# Patient Record
Sex: Female | Born: 1990 | Race: Black or African American | Hispanic: No | Marital: Single | Smoking: Former smoker
Health system: Southern US, Community
[De-identification: ages and names within clinical notes are randomized; demographics above are authoritative.]

## PROBLEM LIST (undated history)

## (undated) ENCOUNTER — Inpatient Hospital Stay (HOSPITAL_COMMUNITY): Payer: Self-pay

## (undated) DIAGNOSIS — A749 Chlamydial infection, unspecified: Secondary | ICD-10-CM

## (undated) DIAGNOSIS — A599 Trichomoniasis, unspecified: Secondary | ICD-10-CM

## (undated) DIAGNOSIS — A549 Gonococcal infection, unspecified: Secondary | ICD-10-CM

## (undated) HISTORY — PX: THERAPEUTIC ABORTION: SHX798

---
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
## 2012-01-01 LAB — GC/CHLAMYDIA PROBE AMP, GENITAL
Chlamydia, DNA Probe: NEGATIVE
GC Probe Amp, Genital: NEGATIVE

## 2012-01-02 ENCOUNTER — Inpatient Hospital Stay (HOSPITAL_COMMUNITY)
Admission: AD | Admit: 2012-01-02 | Discharge: 2012-01-02 | Disposition: A | Discharge status: Home / Self Care | Payer: Federal, State, Local not specified - PPO | Source: Ambulatory Visit | Attending: Obstetrics & Gynecology | Admitting: Obstetrics & Gynecology

## 2012-01-02 DIAGNOSIS — O99891 Other specified diseases and conditions complicating pregnancy: Secondary | ICD-10-CM | POA: Insufficient documentation

## 2012-01-02 DIAGNOSIS — Z09 Encounter for follow-up examination after completed treatment for conditions other than malignant neoplasm: Secondary | ICD-10-CM

## 2012-01-02 LAB — HCG, QUANTITATIVE, PREGNANCY: hCG, Beta Chain, Quant, S: 3296 m[IU]/mL — ABNORMAL HIGH (ref <5)

## 2012-01-02 NOTE — MAU Provider Note (Signed)
Lauren Diaz is a 21 y.o. female who returns to MAU for follow up BHCG. On her visit 2 days ago the Bhcg was 1517 and ultrasound showed an IUGS but no YS. Patient denies pain or bleeding today.  Results for orders placed during the hospital encounter of 01/02/12 (from the past 24 hour(s))  HCG, QUANTITATIVE, PREGNANCY     Status: Abnormal   Collection Time   01/02/12  4:10 PM      Component Value Range   hCG, Beta Chain, Quant, S 3296 (*) <5 mIU/mL   BP 122/72  Pulse 91  Temp 99.8 F (37.7 C) (Oral)  Resp 16  LMP 05/31/2011  There is a normal rise in the Bhcg today.   Assessment : Normal rise in Bhcg   First trimester pregnancy  Plan:  Start prenatal care   Return for any problems. Medication List  As of 01/02/2012  4:56 PM   CONTINUE taking these medications         metroNIDAZOLE 500 MG tablet   Commonly known as: FLAGYL   Take 1 tablet (500 mg total) by mouth 2 (two) times daily.           Follow-up Information    Schedule an appointment as soon as possible for a visit to follow up. (with OB of  choice)        I have reviewed this patient's vital signs, nurses notes, appropriate labs and imaging. I have discussed results and follow up plan of care with the patient and she voices understanding.

## 2012-01-02 NOTE — MAU Note (Signed)
Patient here for  BHCG denies pain or bleeding.

## 2012-01-03 NOTE — MAU Provider Note (Signed)
Agree with above note.  Novelle Addair 01/03/2012 9:13 AM   

## 2012-08-19 ENCOUNTER — Encounter (HOSPITAL_COMMUNITY): Payer: Self-pay | Admitting: Cardiology

## 2012-08-19 ENCOUNTER — Emergency Department (HOSPITAL_COMMUNITY)
Admission: EM | Admit: 2012-08-19 | Discharge: 2012-08-19 | Disposition: A | Discharge status: Home / Self Care | Payer: Federal, State, Local not specified - PPO | Attending: Emergency Medicine | Admitting: Emergency Medicine

## 2012-08-19 DIAGNOSIS — R109 Unspecified abdominal pain: Secondary | ICD-10-CM | POA: Insufficient documentation

## 2012-08-19 DIAGNOSIS — Z8619 Personal history of other infectious and parasitic diseases: Secondary | ICD-10-CM | POA: Insufficient documentation

## 2012-08-19 DIAGNOSIS — R61 Generalized hyperhidrosis: Secondary | ICD-10-CM | POA: Insufficient documentation

## 2012-08-19 DIAGNOSIS — Z3202 Encounter for pregnancy test, result negative: Secondary | ICD-10-CM | POA: Insufficient documentation

## 2012-08-19 DIAGNOSIS — N898 Other specified noninflammatory disorders of vagina: Secondary | ICD-10-CM | POA: Insufficient documentation

## 2012-08-19 DIAGNOSIS — R112 Nausea with vomiting, unspecified: Secondary | ICD-10-CM | POA: Insufficient documentation

## 2012-08-19 DIAGNOSIS — F17200 Nicotine dependence, unspecified, uncomplicated: Secondary | ICD-10-CM | POA: Insufficient documentation

## 2012-08-19 LAB — CBC WITH DIFFERENTIAL/PLATELET
Eosinophils Absolute: 0 10*3/uL (ref 0.0–0.7)
Eosinophils Relative: 0 % (ref 0–5)
HCT: 36.3 % (ref 36.0–46.0)
Hemoglobin: 12.5 g/dL (ref 12.0–15.0)
Lymphs Abs: 1.1 10*3/uL (ref 0.7–4.0)
MCH: 30.1 pg (ref 26.0–34.0)
MCV: 87.5 fL (ref 78.0–100.0)
Monocytes Absolute: 1 10*3/uL (ref 0.1–1.0)
Monocytes Relative: 7 % (ref 3–12)
RBC: 4.15 MIL/uL (ref 3.87–5.11)

## 2012-08-19 LAB — BASIC METABOLIC PANEL
BUN: 8 mg/dL (ref 6–23)
Calcium: 9.2 mg/dL (ref 8.4–10.5)
Creatinine, Ser: 0.74 mg/dL (ref 0.50–1.10)
GFR calc non Af Amer: >90 mL/min (ref >90)
Glucose, Bld: 99 mg/dL (ref 70–99)
Potassium: 3.4 mEq/L — ABNORMAL LOW (ref 3.5–5.1)

## 2012-08-19 LAB — URINALYSIS, ROUTINE W REFLEX MICROSCOPIC
Glucose, UA: NEGATIVE mg/dL
Protein, ur: 30 mg/dL — AB

## 2012-08-19 LAB — POCT PREGNANCY, URINE: Preg Test, Ur: NEGATIVE

## 2012-08-19 LAB — URINE MICROSCOPIC-ADD ON

## 2012-08-19 MED ORDER — ONDANSETRON HCL 4 MG PO TABS: 4.0000 mg | ORAL_TABLET | Freq: Four times a day (QID) | ORAL | Status: DC

## 2012-08-19 MED ORDER — ONDANSETRON HCL 4 MG/2ML IJ SOLN
4.0000 mg | INTRAMUSCULAR | Status: AC
  Administered 2012-08-19: 4 mg via INTRAVENOUS

## 2012-08-19 MED ORDER — ONDANSETRON HCL 4 MG/2ML IJ SOLN
INTRAMUSCULAR | Status: AC
  Filled 2012-08-19: qty 2

## 2012-08-19 MED ORDER — ONDANSETRON HCL 4 MG/2ML IJ SOLN
4.0000 mg | INTRAMUSCULAR | Status: AC
  Administered 2012-08-19: 4 mg via INTRAVENOUS
  Filled 2012-08-19: qty 2

## 2012-08-19 MED ORDER — SODIUM CHLORIDE 0.9 % IV BOLUS (SEPSIS)
1000.0000 mL | Freq: Once | INTRAVENOUS | Status: AC
  Administered 2012-08-19: 1000 mL via INTRAVENOUS

## 2012-08-19 NOTE — ED Notes (Signed)
Pt reports mid abdominal pain starting at 0600. States that she has had constant nausea and vomited x 20 times today. Denies dysuria, vaginal discharge, and odor. Pt is having her menses at this time. Pt reports being recently diagnosed with an STD two weeks ago.

## 2012-08-19 NOTE — ED Notes (Signed)
Pt reports generalized abd pain that started this morning when she woke up. States she has had some episodes of vomiting today. Denies any urinary symptoms or flank pain.

## 2012-08-19 NOTE — ED Provider Notes (Signed)
History     CSN: [id number]  Arrival date & time 08/19/12  1320   First MD Initiated Contact with Patient 08/19/12 [phone]      Chief Complaint  Patient presents with  . Abdominal Pain  . Emesis    (Consider location/radiation/quality/duration/timing/severity/associated sxs/prior treatment) HPI Comments: Patient is a 22 year old female who presents for multiple episodes of emesis starting at 9:00 this morning. Patient states she woke up feeling nauseous and has had approximately 30 episodes of nonbloody emesis since symptom onset. Patient states she has not been able to keep down food or fluids by mouth and her last episode of emesis was 15 minutes ago while waiting in the waiting room. Patient denies any aggravating or alleviating factors. Patient admits to an aching periumbilical pain as well as becoming diaphoretic after her episodes of emesis. Patient denies fever, vision changes, inability to swallow, chest pain, shortness of breath, diarrhea, or numbness or tingling in her extremities. Patient states her last bowel movement and was this morning and was normal in color and consistency. Patient's menstrual cycle began 2 days ago. Patient denies a history of abdominal surgery.  Patient is a 22 y.o. female presenting with abdominal pain and vomiting. The history is provided by the patient. No language interpreter was used.  Abdominal Pain Associated symptoms: nausea and vomiting   Associated symptoms: no chest pain, no chills, no constipation, no diarrhea, no dysuria, no fever, no hematuria and no shortness of breath   Emesis Associated symptoms: abdominal pain   Associated symptoms: no chills and no diarrhea     Past Medical History  Diagnosis Date  . Trichomonas   . Gonorrhea   . Chlamydia     Past Surgical History  Procedure Laterality Date  . No past surgeries      Family History  Problem Relation Age of Onset  . Anesthesia problems Neg Hx     History  Substance Use  Topics  . Smoking status: Current Some Day Smoker  . Smokeless tobacco: Never Used  . Alcohol Use: Yes     Comment: weekends    OB History   Grav Para Term Preterm Abortions TAB SAB Ect Mult Living   1               Review of Systems  Constitutional: Negative for fever and chills.  HENT: Negative for trouble swallowing, neck pain and neck stiffness.   Eyes: Negative for visual disturbance.  Respiratory: Negative for chest tightness and shortness of breath.   Cardiovascular: Negative for chest pain.  Gastrointestinal: Positive for nausea, vomiting and abdominal pain. Negative for diarrhea, constipation and blood in stool.  Genitourinary: Negative for dysuria, urgency, hematuria, vaginal pain and pelvic pain.       Patient currently menstruating  Musculoskeletal: Negative for back pain.  Neurological: Negative for dizziness, syncope, weakness, light-headedness and numbness.  All other systems reviewed and are negative.    Allergies  Penicillins  Home Medications   Current Outpatient Rx  Name  Route  Sig  Dispense  Refill  . Lactobacillus (REPHRESH PRO-B) CAPS   Oral   Take 1 capsule by mouth daily.         . ondansetron (ZOFRAN) 4 MG tablet   Oral   Take 1 tablet (4 mg total) by mouth every 6 (six) hours.   12 tablet   0     BP 106/52  Pulse 96  Temp(Src) 99.2 F (37.3 C) (Oral)  Resp 16  SpO2  98%  LMP 08/17/2012  Physical Exam  Nursing note and vitals reviewed. Constitutional: She is oriented to person, place, and time. She appears well-developed and well-nourished. No distress.  Patient is lying comfortably in her bed, smiling, and conversing happily. She is nontoxic appearing and in no acute distress.  HENT:  Head: Normocephalic and atraumatic.  Mouth/Throat: Oropharynx is clear and moist.  Neck: Normal range of motion. Neck supple.  Cardiovascular: Normal rate, regular rhythm, normal heart sounds and intact distal pulses.   Pulmonary/Chest: Effort  normal and breath sounds normal. No respiratory distress. She has no wheezes. She has no rales.  Abdominal: Soft. Bowel sounds are normal. She exhibits no distension and no mass. There is no rebound and no guarding.  Patient without focal tenderness on deep palpation of abdomen. No peritoneal signs or suprapubic tenderness. No Murphy's sign or tenderness at McBurney's point.  Musculoskeletal: Normal range of motion.  Neurological: She is alert and oriented to person, place, and time.  Skin: Skin is warm and dry. No rash noted. She is not diaphoretic. No erythema.  Psychiatric: She has a normal mood and affect. Her behavior is normal.    ED Course  Procedures (including critical care time)  Labs Reviewed  CBC WITH DIFFERENTIAL - Abnormal; Notable for the following:    WBC 14.1 (*)    Neutrophils Relative 85 (*)    Neutro Abs 11.9 (*)    Lymphocytes Relative 8 (*)    All other components within normal limits  BASIC METABOLIC PANEL - Abnormal; Notable for the following:    Potassium 3.4 (*)    All other components within normal limits  URINALYSIS, ROUTINE W REFLEX MICROSCOPIC - Abnormal; Notable for the following:    Hgb urine dipstick LARGE (*)    Protein, ur 30 (*)    Leukocytes, UA TRACE (*)    All other components within normal limits  LIPASE, BLOOD  URINE MICROSCOPIC-ADD ON  POCT PREGNANCY, URINE   No results found.   1. Nausea and vomiting      MDM  Patient is a 22 year old female who presents after having multiple episodes of emesis onset 9 AM this morning. Patient's lab significant for mild leukocytosis of 14.1 and mild hypokalemia of 3.4. Patient's urinalysis findings consistent with dehydration and the patient's current menstrual cycle; urine pregnancy negative. Patient's lipase not elevated. Patient without focal tenderness on deep palpation or peritoneal signs; no tenderness at McBurney's point or Murphy's sign. The patient is conversing happily and nontoxic appearing,  in no acute distress. Will manage patient in ED today with IV fluids as well as IV Zofran for nausea.  Patient states that nausea has improved and she is now tolerating fluids by mouth. She has remained well and nontoxic appearing; hemodynamically stable. Patient will be discharged with primary care followup and Zofran to take as needed for nausea and vomiting. Indications for ED return discussed. Patient states comfort and understanding with this discharge plan without any unaddressed concerns. Patient work up and management discussed with Dr. Blinda Leatherwood who is in agreement.      Antony Madura, PA-C 08/20/12 1200

## 2012-08-20 NOTE — ED Provider Notes (Signed)
Medical screening examination/treatment/procedure(s) were performed by non-physician practitioner and as supervising physician I was immediately available for consultation/collaboration.  Christopher J. Pollina, MD 08/20/12 1700 

## 2012-09-18 ENCOUNTER — Inpatient Hospital Stay (HOSPITAL_COMMUNITY)
Admission: AD | Admit: 2012-09-18 | Discharge: 2012-09-18 | Disposition: A | Discharge status: Home / Self Care | Payer: Federal, State, Local not specified - PPO | Source: Ambulatory Visit | Attending: Obstetrics and Gynecology | Admitting: Obstetrics and Gynecology

## 2012-09-18 ENCOUNTER — Encounter (HOSPITAL_COMMUNITY): Payer: Self-pay | Admitting: *Deleted

## 2012-09-18 DIAGNOSIS — Z3201 Encounter for pregnancy test, result positive: Secondary | ICD-10-CM

## 2012-09-18 MED ORDER — PRENATAL 27-0.8 MG PO TABS: 1.0000 | ORAL_TABLET | Freq: Every day | ORAL | Status: DC

## 2012-09-18 NOTE — MAU Provider Note (Signed)
Attestation of Attending Supervision of Advanced Practitioner (CNM/NP): Evaluation and management procedures were performed by the Advanced Practitioner under my supervision and collaboration.  I have reviewed the Advanced Practitioner's note and chart, and I agree with the management and plan.  Faye Sanfilippo 09/18/2012 7:26 PM   

## 2012-09-18 NOTE — MAU Note (Signed)
Need to have a preg test done, need to have an [place] to find out how far along she is.  2 pos HPT, one on Fri, one on Sat.  No pain or bleeding. No problems.

## 2012-09-18 NOTE — MAU Provider Note (Signed)
History     CSN: [id number]  Arrival date and time: 09/18/12 1640   None     Chief Complaint  Patient presents with  . Possible Pregnancy   HPI 22 y.o. G2P0010 at [redacted]w[redacted]d here requesting pregnancy verification. Denies pain or bleeding. Patient's last menstrual period was 08/17/2012. Had a negative UPT at the ED on 08/20/12.     Past Medical History  Diagnosis Date  . Trichomonas   . Gonorrhea   . Chlamydia     Past Surgical History  Procedure Laterality Date  . No past surgeries      Family History  Problem Relation Age of Onset  . Anesthesia problems Neg Hx     History  Substance Use Topics  . Smoking status: Current Some Day Smoker  . Smokeless tobacco: Never Used  . Alcohol Use: Yes     Comment: weekends    Allergies:  Allergies  Allergen Reactions  . Penicillins Rash and Other (See Comments)    Childhood reaction    Prescriptions prior to admission  Medication Sig Dispense Refill  . Lactobacillus (REPHRESH PRO-B) CAPS Take 1 capsule by mouth daily.      . ondansetron (ZOFRAN) 4 MG tablet Take 1 tablet (4 mg total) by mouth every 6 (six) hours.  12 tablet  0    Review of Systems  Constitutional: Negative.   Respiratory: Negative.   Cardiovascular: Negative.   Gastrointestinal: Negative for nausea, vomiting, abdominal pain, diarrhea and constipation.  Genitourinary: Negative for dysuria, urgency, frequency, hematuria and flank pain.       Negative for vaginal bleeding, vaginal discharge, dyspareunia  Musculoskeletal: Negative.   Neurological: Negative.   Psychiatric/Behavioral: Negative.    Physical Exam   Blood pressure 111/71, pulse 104, temperature 98.5 F (36.9 C), temperature source Oral, resp. rate 18, height 5\' 5"  (1.651 m), weight 178 lb (80.74 kg), last menstrual period 08/17/2012.  Physical Exam  Nursing note and vitals reviewed. Constitutional: She is oriented to person, place, and time. She appears well-developed and well-nourished.  No distress.  Cardiovascular: Normal rate.   Respiratory: Effort normal.  Musculoskeletal: Normal range of motion.  Neurological: She is alert and oriented to person, place, and time.  Skin: Skin is warm and dry.  Psychiatric: She has a normal mood and affect.    MAU Course  Procedures  Results for orders placed during the hospital encounter of 09/18/12 (from the past 24 hour(s))  POCT PREGNANCY, URINE     Status: Abnormal   Collection Time    09/18/12  5:23 PM      Result Value Range   Preg Test, Ur POSITIVE (*) NEGATIVE     Assessment and Plan   1. Positive pregnancy test   Pregnancy verification given List of prenatal care providers given Start prenatal care as soon as possible Precautions rev'd    Medication List    TAKE these medications       multivitamin-prenatal 27-0.8 MG Tabs  Take 1 tablet by mouth daily.     ondansetron 4 MG tablet  Commonly known as:  ZOFRAN  Take 1 tablet (4 mg total) by mouth every 6 (six) hours.     REPHRESH PRO-B Caps  Take 1 capsule by mouth daily.            Follow-up Information   Follow up with provider of your choice. (start prenatal care as soon as possible)         Sakeenah Valcarcel 09/18/2012, 5:25 PM

## 2012-09-30 ENCOUNTER — Inpatient Hospital Stay (HOSPITAL_COMMUNITY): Payer: Federal, State, Local not specified - PPO

## 2012-09-30 ENCOUNTER — Encounter (HOSPITAL_COMMUNITY): Payer: Self-pay | Admitting: Family

## 2012-09-30 ENCOUNTER — Inpatient Hospital Stay (HOSPITAL_COMMUNITY)
Admission: AD | Admit: 2012-09-30 | Discharge: 2012-09-30 | Disposition: A | Discharge status: Home / Self Care | Payer: Federal, State, Local not specified - PPO | Source: Ambulatory Visit | Attending: Obstetrics & Gynecology | Admitting: Obstetrics & Gynecology

## 2012-09-30 DIAGNOSIS — O2390 Unspecified genitourinary tract infection in pregnancy, unspecified trimester: Secondary | ICD-10-CM | POA: Insufficient documentation

## 2012-09-30 DIAGNOSIS — A499 Bacterial infection, unspecified: Secondary | ICD-10-CM | POA: Insufficient documentation

## 2012-09-30 DIAGNOSIS — N760 Acute vaginitis: Secondary | ICD-10-CM | POA: Insufficient documentation

## 2012-09-30 DIAGNOSIS — B9689 Other specified bacterial agents as the cause of diseases classified elsewhere: Secondary | ICD-10-CM | POA: Insufficient documentation

## 2012-09-30 DIAGNOSIS — O209 Hemorrhage in early pregnancy, unspecified: Secondary | ICD-10-CM | POA: Insufficient documentation

## 2012-09-30 LAB — URINALYSIS, ROUTINE W REFLEX MICROSCOPIC
Bilirubin Urine: NEGATIVE
Protein, ur: NEGATIVE mg/dL
Urobilinogen, UA: 0.2 mg/dL (ref 0.0–1.0)

## 2012-09-30 LAB — URINE MICROSCOPIC-ADD ON

## 2012-09-30 LAB — CBC
MCH: 29.9 pg (ref 26.0–34.0)
MCHC: 33.4 g/dL (ref 30.0–36.0)
MCV: 89.4 fL (ref 78.0–100.0)
Platelets: 221 10*3/uL (ref 150–400)
RDW: 13.4 % (ref 11.5–15.5)

## 2012-09-30 MED ORDER — METRONIDAZOLE 500 MG PO TABS: 500.0000 mg | ORAL_TABLET | Freq: Two times a day (BID) | ORAL | Status: DC

## 2012-09-30 NOTE — MAU Note (Signed)
Patient presents to MAU with c/o vaginal bleeding during intercourse at 0530 today. Reports noticing red blood upon wiping. Denies abdominal cramping.

## 2012-09-30 NOTE — MAU Note (Signed)
Pt reports she had intercourse last night and started having some vaginal bleeding. Denies any pain or cramping and bleeding has stopped at this time. Pt worried she might be having a miscarriage.

## 2012-09-30 NOTE — MAU Provider Note (Signed)
History     CSN: [id number]  Arrival date and time: 09/30/12 1116   None     Chief Complaint  Patient presents with  . Vaginal Bleeding   HPI  Pt is G20010 [redacted]w[redacted]d pregnant by LMP presents with post coital spotting this morning after IC last night.  Pt denies pain or cramping.  Pt states that since she found out she was pregnant, she does not "feel" pregnant anymore- no breast tenderness or bloating.   Pt has hx of Gonorrhea and Chlamydia and trichomonas. Pt is O+ from review of previous labs.  Past Medical History  Diagnosis Date  . Trichomonas   . Gonorrhea   . Chlamydia     Past Surgical History  Procedure Laterality Date  . Therapeutic abortion      Family History  Problem Relation Age of Onset  . Anesthesia problems Neg Hx   . Heart disease Father   . Cancer Maternal Grandfather     prostate  . Kidney disease Paternal Grandmother     History  Substance Use Topics  . Smoking status: Former Smoker    Types: Cigarettes  . Smokeless tobacco: Never Used     Comment: quit with pos preg test  . Alcohol Use: Yes     Comment: weekends    Allergies:  Allergies  Allergen Reactions  . Penicillins Rash and Other (See Comments)    Childhood reaction    Prescriptions prior to admission  Medication Sig Dispense Refill  . Prenatal Vit-Fe Fumarate-FA (PRENATAL MULTIVITAMIN) TABS Take 1 tablet by mouth daily at 12 noon.        Review of Systems  Gastrointestinal: Negative for nausea, vomiting and abdominal pain.  Genitourinary: Negative for dysuria and urgency.   Physical Exam   Blood pressure 111/56, pulse 89, temperature 98.6 F (37 C), temperature source Oral, resp. rate 18, height 5\' 5"  (1.651 m), weight 82.192 kg (181 lb 3.2 oz), last menstrual period 08/17/2012.  Physical Exam  Nursing note and vitals reviewed. Constitutional: She is oriented to person, place, and time. She appears well-developed and well-nourished. No distress.  HENT:  Head:  Normocephalic.  Eyes: Pupils are equal, round, and reactive to light.  Neck: Normal range of motion. Neck supple.  Cardiovascular: Normal rate.   Respiratory: Effort normal.  GI: Soft. She exhibits no distension. There is no tenderness. There is no rebound.  Genitourinary:  Small amount of frothy brown discharge in vault; cervix clean,NT; uterus retroverted difficult to asses size, NT; adnexa without palpable enlargement or tenderness  Musculoskeletal: Normal range of motion.  Neurological: She is alert and oriented to person, place, and time.  Skin: Skin is warm and dry.  Psychiatric: She has a normal mood and affect.    MAU Course  Procedures Results for orders placed during the hospital encounter of 09/30/12 (from the past 24 hour(s))  URINALYSIS, ROUTINE W REFLEX MICROSCOPIC     Status: Abnormal   Collection Time    09/30/12 11:30 AM      Result Value Range   Color, Urine YELLOW  YELLOW   APPearance CLEAR  CLEAR   Specific Gravity, Urine 1.025  1.005 - 1.030   pH 6.0  5.0 - 8.0   Glucose, UA NEGATIVE  NEGATIVE mg/dL   Hgb urine dipstick SMALL (*) NEGATIVE   Bilirubin Urine NEGATIVE  NEGATIVE   Ketones, ur NEGATIVE  NEGATIVE mg/dL   Protein, ur NEGATIVE  NEGATIVE mg/dL   Urobilinogen, UA 0.2  0.0 -  1.0 mg/dL   Nitrite NEGATIVE  NEGATIVE   Leukocytes, UA NEGATIVE  NEGATIVE  URINE MICROSCOPIC-ADD ON     Status: Abnormal   Collection Time    09/30/12 11:30 AM      Result Value Range   Squamous Epithelial / LPF FEW (*) RARE   WBC, UA 3-6  <3 WBC/hpf   RBC / HPF 0-2  <3 RBC/hpf   Bacteria, UA RARE  RARE   Urine-Other MUCOUS PRESENT    CBC     Status: Abnormal   Collection Time    09/30/12 11:47 AM      Result Value Range   WBC 8.8  4.0 - 10.5 K/uL   RBC 3.88  3.87 - 5.11 MIL/uL   Hemoglobin 11.6 (*) 12.0 - 15.0 g/dL   HCT 16.1 (*) 09.6 - 04.5 %   MCV 89.4  78.0 - 100.0 fL   MCH 29.9  26.0 - 34.0 pg   MCHC 33.4  30.0 - 36.0 g/dL   RDW 40.9  81.1 - 91.4 %   Platelets  221  150 - 400 K/uL  HCG, QUANTITATIVE, PREGNANCY     Status: Abnormal   Collection Time    09/30/12 11:47 AM      Result Value Range   hCG, Beta Chain, Quant, S 35150 (*) <5 mIU/mL  WET PREP, GENITAL     Status: Abnormal   Collection Time    09/30/12 11:52 AM      Result Value Range   Yeast Wet Prep HPF POC NONE SEEN  NONE SEEN   Trich, Wet Prep NONE SEEN  NONE SEEN   Clue Cells Wet Prep HPF POC MODERATE (*) NONE SEEN   WBC, Wet Prep HPF POC TOO NUMEROUS TO COUNT (*) NONE SEEN    Assessment and Plan  Bleeding in pregnancy Viable single IUP [redacted]w[redacted]d with [facility] 05/24/2013 BV-Flagyl 500mg  BID for 7 days Proceed to OB care  Tonita Bills 09/30/2012, 12:01 PM

## 2012-10-02 LAB — GC/CHLAMYDIA PROBE AMP: GC Probe RNA: NEGATIVE

## 2012-10-17 LAB — OB RESULTS CONSOLE RUBELLA ANTIBODY, IGM: Rubella: IMMUNE

## 2012-10-17 LAB — OB RESULTS CONSOLE HIV ANTIBODY (ROUTINE TESTING): HIV: NONREACTIVE

## 2012-10-17 LAB — OB RESULTS CONSOLE HEPATITIS B SURFACE ANTIGEN: Hepatitis B Surface Ag: NEGATIVE

## 2012-10-17 LAB — OB RESULTS CONSOLE GC/CHLAMYDIA: Gonorrhea: NEGATIVE

## 2012-10-17 LAB — OB RESULTS CONSOLE RPR: RPR: NONREACTIVE

## 2012-11-01 ENCOUNTER — Encounter (HOSPITAL_COMMUNITY): Payer: Self-pay

## 2012-11-01 ENCOUNTER — Inpatient Hospital Stay (HOSPITAL_COMMUNITY)
Admission: AD | Admit: 2012-11-01 | Discharge: 2012-11-01 | Disposition: A | Discharge status: Home / Self Care | Payer: Federal, State, Local not specified - PPO | Source: Ambulatory Visit | Attending: Obstetrics and Gynecology | Admitting: Obstetrics and Gynecology

## 2012-11-01 DIAGNOSIS — O26899 Other specified pregnancy related conditions, unspecified trimester: Secondary | ICD-10-CM

## 2012-11-01 DIAGNOSIS — R109 Unspecified abdominal pain: Secondary | ICD-10-CM

## 2012-11-01 DIAGNOSIS — E860 Dehydration: Secondary | ICD-10-CM

## 2012-11-01 DIAGNOSIS — O99891 Other specified diseases and conditions complicating pregnancy: Secondary | ICD-10-CM | POA: Insufficient documentation

## 2012-11-01 LAB — URINALYSIS, ROUTINE W REFLEX MICROSCOPIC
Bilirubin Urine: NEGATIVE
Glucose, UA: NEGATIVE mg/dL
Hgb urine dipstick: NEGATIVE
Specific Gravity, Urine: >1.03 — ABNORMAL HIGH (ref 1.005–1.030)
Urobilinogen, UA: 0.2 mg/dL (ref 0.0–1.0)
pH: 6 (ref 5.0–8.0)

## 2012-11-01 NOTE — MAU Note (Signed)
Pt reports lower abd pain , cramping. Denies bleeding. Denies dysuria. Pt states she thinks she is dehydrated but denies vomiting

## 2012-11-01 NOTE — MAU Provider Note (Signed)
History     CSN: [id number]  Arrival date and time: 11/01/12 2003   First Provider Initiated Contact with Patient 11/01/12 2052      Chief Complaint  Patient presents with  . Abdominal Pain   HPI Lauren Diaz is 22 y.o. G2P0010 [redacted]w[redacted]d weeks presenting with report of a burning feeling in her stomach at 5pm today.  Says it feels like was pulling and separating. States the pain has moved from her mid abdomen to her lower abdomen.  Lying down makes the pain worse.  Nothing makes it better.  Drank water to help pain.  Rates the worse pain is 9/10 and now it is a 6/10.   Gets hot and chilled when she voids.  Normal BM yesterday.  Denies vaginal bleeding or discharge.  Patient of Dr. Langston Masker.  Denies vaginal discharge or bleeding.      Past Medical History  Diagnosis Date  . Trichomonas   . Gonorrhea   . Chlamydia     Past Surgical History  Procedure Laterality Date  . Therapeutic abortion      Family History  Problem Relation Age of Onset  . Anesthesia problems Neg Hx   . Heart disease Father   . Cancer Maternal Grandfather     prostate  . Kidney disease Paternal Grandmother     History  Substance Use Topics  . Smoking status: Former Smoker    Types: Cigarettes  . Smokeless tobacco: Never Used     Comment: quit with pos preg test  . Alcohol Use: No     Comment: weekends    Allergies:  Allergies  Allergen Reactions  . Penicillins Rash and Other (See Comments)    Childhood reaction    Prescriptions prior to admission  Medication Sig Dispense Refill  . flintstones complete (FLINTSTONES) 60 MG chewable tablet Chew 2 tablets by mouth daily.      . ondansetron (ZOFRAN-ODT) 8 MG disintegrating tablet Take 8 mg by mouth every 8 (eight) hours as needed for nausea.      . metroNIDAZOLE (FLAGYL) 500 MG tablet Take 1 tablet (500 mg total) by mouth 2 (two) times daily.  14 tablet  0  . Prenatal Vit-Fe Fumarate-FA (PRENATAL MULTIVITAMIN) TABS Take 1 tablet by mouth daily at  12 noon.        Review of Systems  Constitutional: Positive for chills. Negative for fever.  Gastrointestinal: Positive for nausea and abdominal pain. Negative for vomiting.  Genitourinary: Negative for dysuria, urgency, frequency, hematuria and flank pain.       Neg for abnormal discharge or bleeding.    Physical Exam   Blood pressure 127/63, pulse 72, temperature 98.7 F (37.1 C), temperature source Oral, resp. rate 18, height 5\' 6"  (1.676 m), weight 180 lb (81.647 kg), last menstrual period 08/17/2012, SpO2 100.00%.  Physical Exam  Constitutional: She is oriented to person, place, and time. She appears well-developed and well-nourished. No distress.  HENT:  Head: Normocephalic.  Neck: Normal range of motion.  Cardiovascular: Normal rate.   Respiratory: Effort normal.  GI: Soft. She exhibits no distension and no mass. There is no tenderness. There is no rebound and no guarding.  Genitourinary: There is no rash, tenderness or lesion on the right labia. There is no rash, tenderness or lesion on the left labia. Uterus is enlarged and tender (slightly tender over the bladder). Cervix exhibits no motion tenderness, no discharge and no friability. Right adnexum displays no mass, no tenderness and no fullness. Left  adnexum displays no mass, no tenderness and no fullness. Vaginal discharge (normal appearing white discharge without odor) found.  Neurological: She is alert and oriented to person, place, and time.  Skin: Skin is warm and dry.  Psychiatric: She has a normal mood and affect. Her behavior is normal.   Results for orders placed during the hospital encounter of 11/01/12 (from the past 24 hour(s))  URINALYSIS, ROUTINE W REFLEX MICROSCOPIC     Status: Abnormal   Collection Time    11/01/12  8:19 PM      Result Value Range   Color, Urine YELLOW  YELLOW   APPearance CLEAR  CLEAR   Specific Gravity, Urine >1.030 (*) 1.005 - 1.030   pH 6.0  5.0 - 8.0   Glucose, UA NEGATIVE  NEGATIVE  mg/dL   Hgb urine dipstick NEGATIVE  NEGATIVE   Bilirubin Urine NEGATIVE  NEGATIVE   Ketones, ur NEGATIVE  NEGATIVE mg/dL   Protein, ur NEGATIVE  NEGATIVE mg/dL   Urobilinogen, UA 0.2  0.0 - 1.0 mg/dL   Nitrite NEGATIVE  NEGATIVE   Leukocytes, UA NEGATIVE  NEGATIVE   MAU Course  Procedures  MDM Reported MSE to Dr. Vincente Poli.  Order given for exam-if negative discharge to home with instruction to hydrate and eat healthy diet.  Follow up in office with Dr. Langston Masker is sxs persist.  Assessment and Plan  A:  Mild dehydration at [redacted]w[redacted]d gestation      Abdominal pain in first trimester pregnancy  P:  Strongly encouraged her to stay well hydrated with 6-8 glasses of water a day      Avoid spicy, greasy, fried food and colas      Follow up with Dr. Langston Masker on 6/23 as scheduled or sooner if sxs persist/worsen  KEY,EVE M 11/01/2012, 8:55 PM

## 2012-12-30 ENCOUNTER — Inpatient Hospital Stay (HOSPITAL_COMMUNITY)
Admission: AD | Admit: 2012-12-30 | Discharge: 2012-12-30 | Disposition: A | Discharge status: Home / Self Care | Payer: Federal, State, Local not specified - PPO | Source: Ambulatory Visit | Attending: Obstetrics and Gynecology | Admitting: Obstetrics and Gynecology

## 2012-12-30 ENCOUNTER — Encounter (HOSPITAL_COMMUNITY): Payer: Self-pay | Admitting: Family

## 2012-12-30 DIAGNOSIS — R059 Cough, unspecified: Secondary | ICD-10-CM | POA: Insufficient documentation

## 2012-12-30 DIAGNOSIS — O99891 Other specified diseases and conditions complicating pregnancy: Secondary | ICD-10-CM | POA: Insufficient documentation

## 2012-12-30 DIAGNOSIS — J069 Acute upper respiratory infection, unspecified: Secondary | ICD-10-CM | POA: Insufficient documentation

## 2012-12-30 DIAGNOSIS — R05 Cough: Secondary | ICD-10-CM

## 2012-12-30 DIAGNOSIS — J3489 Other specified disorders of nose and nasal sinuses: Secondary | ICD-10-CM | POA: Insufficient documentation

## 2012-12-30 LAB — URINALYSIS, ROUTINE W REFLEX MICROSCOPIC
Glucose, UA: NEGATIVE mg/dL
Leukocytes, UA: NEGATIVE
Protein, ur: NEGATIVE mg/dL
Specific Gravity, Urine: 1.025 (ref 1.005–1.030)
pH: 6.5 (ref 5.0–8.0)

## 2012-12-30 MED ORDER — HYDROCODONE-HOMATROPINE 5-1.5 MG/5ML PO SYRP: 5.0000 mL | ORAL_SOLUTION | Freq: Four times a day (QID) | ORAL | Status: DC | PRN

## 2012-12-30 NOTE — MAU Provider Note (Signed)
History     CSN: [id number]  Arrival date and time: 12/30/12 1636   None     Chief Complaint  Patient presents with  . Dizziness  . Nasal Congestion   HPI Lauren Diaz 22 y.o. [redacted]w[redacted]d  Comes to MAU today with nasal congestion and cough for 3 days.  Has not had a fever, but has felt hot.  Last night, had coughing in the shower and vomited.  Today was at [employer] getting some medication, and felt dizzy and like she was going to pass out.  Sputum was yellow and green sometimes so she was worried that she was getting very itll.  OB History   Grav Para Term Preterm Abortions TAB SAB Ect Mult Living   2    1 1  0         Past Medical History  Diagnosis Date  . Trichomonas   . Gonorrhea   . Chlamydia     Past Surgical History  Procedure Laterality Date  . Therapeutic abortion      Family History  Problem Relation Age of Onset  . Anesthesia problems Neg Hx   . Heart disease Father   . Cancer Maternal Grandfather     prostate  . Kidney disease Paternal Grandmother     History  Substance Use Topics  . Smoking status: Former Smoker    Types: Cigarettes  . Smokeless tobacco: Never Used     Comment: quit with pos preg test  . Alcohol Use: No     Comment: weekends    Allergies:  Allergies  Allergen Reactions  . Penicillins Rash and Other (See Comments)    Childhood reaction    Prescriptions prior to admission  Medication Sig Dispense Refill  . cetirizine (ZYRTEC) 10 MG tablet Take 10 mg by mouth daily as needed for allergies.      . Prenatal Vit-Fe Fumarate-FA (PRENATAL MULTIVITAMIN) TABS Take 1 tablet by mouth daily at 12 noon.      . ondansetron (ZOFRAN-ODT) 8 MG disintegrating tablet Take 8 mg by mouth every 8 (eight) hours as needed for nausea.        Review of Systems  Constitutional: Negative for fever.  HENT: Positive for congestion. Negative for sore throat.   Eyes: Negative for discharge and redness.  Respiratory: Positive for cough.    Gastrointestinal: Negative for nausea, vomiting, abdominal pain, diarrhea and constipation.  Neurological: Positive for dizziness. Negative for loss of consciousness and headaches.   Physical Exam   Blood pressure 109/55, pulse 126, temperature 98.6 F (37 C), temperature source Oral, resp. rate 16, last menstrual period 08/17/2012.  Physical Exam  Nursing note and vitals reviewed. Constitutional: She is oriented to person, place, and time. She appears well-developed and well-nourished.  HENT:  Head: Normocephalic.  Nasal turbinates red and boggy Parynyx mildly pink Tonsils not enlarged Ears clear bilat - no infection seen  Eyes: EOM are normal.  Neck: Neck supple.  Cardiovascular: Normal rate, regular rhythm and normal heart sounds.   Respiratory: Breath sounds normal. No respiratory distress. She has no wheezes. She has no rales.  Musculoskeletal: Normal range of motion.  Neurological: She is alert and oriented to person, place, and time.  Skin: Skin is warm and dry.  Psychiatric: She has a normal mood and affect.    MAU Course  Procedures  MDM   Assessment and Plan  URI Cough  Plan Cough medication with hydrocodone for nighttime use as cough is worse then. Continue zyrtec  as instructed in the office. Be seen in the office if you develop fever or worsening symptoms. Note for work given to be out tomorrow if still coughing. Drink at least 8 8-oz glasses of water every day. Expect it to take 7-10 days to fully resolve.   Lauren Diaz 12/30/2012, 7:23 PM

## 2012-12-30 NOTE — MAU Note (Signed)
Pt presents with complaints of nasal congestion and dizziness. She states that she was at [employer] and got dizzy and almost passed out. Denies any bleeding or leakage of fluid

## 2012-12-30 NOTE — MAU Note (Signed)
Patient presents to MAU with c/o nasal congestion x 3 days; productive cough with yellow sputum. Zyrtec x 2 days. N/V reported during coughing spells.  Reports she was at [employer] just before coming here; bought guaifenesin 400 mg because it was on approved pregnancy list. Has not taken any yet.

## 2013-03-11 ENCOUNTER — Encounter (HOSPITAL_COMMUNITY): Payer: Self-pay

## 2013-03-11 ENCOUNTER — Inpatient Hospital Stay (HOSPITAL_COMMUNITY)
Admission: AD | Admit: 2013-03-11 | Discharge: 2013-04-16 | DRG: 775 | Disposition: A | Discharge status: Home / Self Care | Payer: Federal, State, Local not specified - PPO | Source: Ambulatory Visit | Attending: Obstetrics and Gynecology | Admitting: Obstetrics and Gynecology

## 2013-03-11 DIAGNOSIS — O4290 Premature rupture of membranes, unspecified as to length of time between rupture and onset of labor, unspecified weeks of gestation: Principal | ICD-10-CM | POA: Diagnosis present

## 2013-03-11 NOTE — MAU Provider Note (Signed)
Chief Complaint:  Rupture of Membranes  First Provider Initiated Contact with Patient 03/11/13 2349     HPI: Lauren Diaz is a 22 y.o. G2P0010 at [redacted]w[redacted]d who presents to maternity admissions reporting leaking clear fluid since 1815. Leaked enough to soak underwear, smaller amounts of leaking since then. Denies contractions, fever, chills, abd pain, or vaginal bleeding. Good fetal movement.   Pregnancy Course: Uncomplicated   Past Medical History: Past Medical History  Diagnosis Date  . Trichomonas   . Gonorrhea   . Chlamydia     Past obstetric history: OB History  Gravida Para Term Preterm AB SAB TAB Ectopic Multiple Living  2    1 0 1       # Outcome Date GA Lbr Len/2nd Weight Sex Delivery Anes PTL Lv  2 CUR           1 TAB               Past Surgical History: Past Surgical History  Procedure Laterality Date  . Therapeutic abortion       Family History: Family History  Problem Relation Age of Onset  . Anesthesia problems Neg Hx   . Heart disease Father   . Cancer Maternal Grandfather     prostate  . Kidney disease Paternal Grandmother     Social History: History  Substance Use Topics  . Smoking status: Former Smoker    Types: Cigarettes  . Smokeless tobacco: Never Used     Comment: quit with pos preg test  . Alcohol Use: No     Comment: weekends    Allergies:  Allergies  Allergen Reactions  . Penicillins Rash and Other (See Comments)    Childhood reaction    Meds:  Prescriptions prior to admission  Medication Sig Dispense Refill  . Prenatal Vit-Fe Fumarate-FA (PRENATAL MULTIVITAMIN) TABS Take 1 tablet by mouth daily at 12 noon.        ROS: Pertinent findings in history of present illness.  Physical Exam  Height 5\' 6"  (1.676 m), weight 86.818 kg (191 lb 6.4 oz), last menstrual period 08/17/2012. GENERAL: Well-developed, well-nourished female in no acute distress.  HEENT: normocephalic HEART: normal rate RESP: normal effort ABDOMEN: Soft,  non-tender, gravid appropriate for gestational age EXTREMITIES: Nontender, no edema NEURO: alert and oriented SPECULUM EXAM: NEFG, scant clear discharge, neg pool, no blood, cervix clean  visually closed.  FHT:  Baseline 140 , moderate variability, accelerations present, no decelerations Contractions: none   MAU Course: OB limited and amnisure ordered per Dr. Rana Snare.  Care of pt turned over to Sharen Counter, CNM at [phone].  Sebastian, CNM 03/12/2013 12:26 AM

## 2013-03-11 NOTE — MAU Note (Signed)
Leaking clear fluid since 615 pm tonight. Denies vaginal bleeding or contractions. Positive fetal movement.

## 2013-03-12 ENCOUNTER — Encounter (HOSPITAL_COMMUNITY): Payer: Self-pay | Admitting: *Deleted

## 2013-03-12 ENCOUNTER — Inpatient Hospital Stay (HOSPITAL_COMMUNITY): Payer: Federal, State, Local not specified - PPO

## 2013-03-12 LAB — AMNISURE RUPTURE OF MEMBRANE (ROM) NOT AT ARMC: Amnisure ROM: POSITIVE

## 2013-03-12 LAB — CBC
Hemoglobin: 11.2 g/dL — ABNORMAL LOW (ref 12.0–15.0)
MCHC: 34.4 g/dL (ref 30.0–36.0)
MCV: 90.3 fL (ref 78.0–100.0)
Platelets: 203 10*3/uL (ref 150–400)
RBC: 3.61 MIL/uL — ABNORMAL LOW (ref 3.87–5.11)
RDW: 13.7 % (ref 11.5–15.5)

## 2013-03-12 LAB — WET PREP, GENITAL: Trich, Wet Prep: NONE SEEN

## 2013-03-12 LAB — TYPE AND SCREEN

## 2013-03-12 LAB — POCT FERN TEST: POCT Fern Test: POSITIVE

## 2013-03-12 MED ORDER — MAGNESIUM SULFATE BOLUS VIA INFUSION
4.0000 g | Freq: Once | INTRAVENOUS | Status: DC
  Filled 2013-03-12: qty 500

## 2013-03-12 MED ORDER — LACTATED RINGERS IV SOLN
INTRAVENOUS | Status: DC
  Administered 2013-03-12: 125 mL/h via INTRAVENOUS
  Administered 2013-03-12 – 2013-03-14 (×5): via INTRAVENOUS

## 2013-03-12 MED ORDER — CLINDAMYCIN PHOSPHATE 900 MG/50ML IV SOLN
900.0000 mg | Freq: Three times a day (TID) | INTRAVENOUS | Status: DC
  Administered 2013-03-12 – 2013-03-14 (×7): 900 mg via INTRAVENOUS
  Filled 2013-03-12 (×8): qty 50

## 2013-03-12 MED ORDER — DOCUSATE SODIUM 50 MG/5ML PO LIQD
100.0000 mg | Freq: Every day | ORAL | Status: DC
  Administered 2013-03-12 – 2013-04-04 (×24): 100 mg via ORAL
  Filled 2013-03-12 (×25): qty 10

## 2013-03-12 MED ORDER — CALCIUM CARBONATE ANTACID 500 MG PO CHEW
2.0000 | CHEWABLE_TABLET | ORAL | Status: DC | PRN
  Administered 2013-03-13 – 2013-04-01 (×5): 400 mg via ORAL
  Filled 2013-03-12 (×9): qty 1

## 2013-03-12 MED ORDER — MAGNESIUM SULFATE 40 G IN LACTATED RINGERS - SIMPLE
2.0000 g/h | INTRAVENOUS | Status: DC
  Administered 2013-03-12: 2 g/h via INTRAVENOUS
  Filled 2013-03-12 (×2): qty 500

## 2013-03-12 MED ORDER — PRENATAL MULTIVITAMIN CH: 1.0000 | ORAL_TABLET | Freq: Every day | ORAL | Status: DC

## 2013-03-12 MED ORDER — DOCUSATE SODIUM 100 MG PO CAPS
100.0000 mg | ORAL_CAPSULE | Freq: Every day | ORAL | Status: DC
  Administered 2013-03-12: 100 mg via ORAL
  Filled 2013-03-12: qty 1

## 2013-03-12 MED ORDER — AZITHROMYCIN 500 MG IV SOLR
500.0000 mg | INTRAVENOUS | Status: AC
  Administered 2013-03-12 – 2013-03-13 (×2): 500 mg via INTRAVENOUS
  Filled 2013-03-12 (×2): qty 500

## 2013-03-12 MED ORDER — ZOLPIDEM TARTRATE 5 MG PO TABS
5.0000 mg | ORAL_TABLET | Freq: Every evening | ORAL | Status: DC | PRN
  Administered 2013-04-12: 5 mg via ORAL
  Filled 2013-03-12 (×2): qty 1

## 2013-03-12 MED ORDER — BETAMETHASONE SOD PHOS & ACET 6 (3-3) MG/ML IJ SUSP
12.0000 mg | INTRAMUSCULAR | Status: AC
  Administered 2013-03-12 – 2013-03-13 (×2): 12 mg via INTRAMUSCULAR
  Filled 2013-03-12 (×2): qty 2

## 2013-03-12 MED ORDER — ACETAMINOPHEN 325 MG PO TABS: 650.0000 mg | ORAL_TABLET | ORAL | Status: DC | PRN

## 2013-03-12 MED ORDER — COMPLETENATE 29-1 MG PO CHEW
1.0000 | CHEWABLE_TABLET | Freq: Every day | ORAL | Status: DC
  Administered 2013-03-12 – 2013-04-11 (×31): 1 via ORAL
  Filled 2013-03-12 (×33): qty 1

## 2013-03-12 MED ORDER — AZITHROMYCIN 500 MG PO TABS
500.0000 mg | ORAL_TABLET | Freq: Every day | ORAL | Status: AC
  Administered 2013-03-14 – 2013-03-18 (×5): 500 mg via ORAL
  Filled 2013-03-12 (×5): qty 1

## 2013-03-12 NOTE — Progress Notes (Signed)
Pt transferred to room 158

## 2013-03-12 NOTE — Progress Notes (Signed)
Report given to Janna Arch RN

## 2013-03-12 NOTE — Consult Note (Addendum)
[facility]  Neonatal Medicine Consultation       03/12/2013    8:54 PM  I was called at the request of the patient's obstetrician (Dr. Rana Snare) to speak to this patient due to potential preterm delivery as early as 42 1/2 weeks.  The patient is a primagravida with PROM last night.  She was admitted late last night and started on betamethasone, antibiotics (clindamycin and Zithromax due to a penicillin allergy), and magnesium sulfate.  She is being tested for chlamydia infection.  GBS status is unknown.  I described to the patient expectations for a baby born this early or later, covering mortality and morbidity (such as respiratory distress, intracranial bleeding, retinopathy, infection, feeding, long-term developmental issues).  We would expect the baby (a boy) to survive and most likely be free of long-term problems.  Prolonging the pregnancy as long as possible would be helpful provided the mother or baby remain in good condition.  Mom plans to breast feed, which I encouraged as being the best for her baby.  I spent 30 minutes reviewing the record, speaking to the patient, and entering appropriate documentation.  More than 50% of the time was spent face to face with patient.   _____________________ Electronically Signed By: Angelita Ingles, MD Neonatologist

## 2013-03-12 NOTE — Consult Note (Signed)
NICU notified of admission.  Consult requested

## 2013-03-12 NOTE — Progress Notes (Signed)
Ur chart review completed.  

## 2013-03-12 NOTE — H&P (Addendum)
Lauren Diaz is a 22 y.o. female presenting for PPROM clear fluid at 71.  Large gush followed by persistent leaking.  Presented to [facility] around midnight and evaluation included Pos Fern and positive Amnisure consistent with PPROM.  US shows Vtx fetus with AFI of 19.  Pregnancy had otherwise been uncomplicated. History OB History   Grav Para Term Preterm Abortions TAB SAB Ect Mult Living   2    1 1  0        Past Medical History  Diagnosis Date  . Trichomonas   . Gonorrhea   . Chlamydia    Past Surgical History  Procedure Laterality Date  . Therapeutic abortion     Family History: family history includes Cancer in her maternal grandfather; Heart disease in her father; Kidney disease in her paternal grandmother. There is no history of Anesthesia problems. Social History:  reports that she has quit smoking. Her smoking use included Cigarettes. She smoked 0.00 packs per day. She has never used smokeless tobacco. She reports that she does not drink alcohol or use illicit drugs.   Prenatal Transfer Tool  Maternal Diabetes: No Genetic Screening: Normal Maternal Ultrasounds/Referrals: Normal Fetal Ultrasounds or other Referrals:  None Maternal Substance Abuse:  No Significant Maternal Medications:  None Significant Maternal Lab Results:  None Other Comments:  None  ROS    Blood pressure 100/54, pulse 89, temperature 98 F (36.7 C), temperature source Oral, resp. rate 18, height 5\' 6"  (1.676 m), weight 86.818 kg (191 lb 6.4 oz), last menstrual period 08/17/2012. Exam Physical Exam  Abd Grravid, nt DTRs 1/4 Per NP Cx appears closed.Marland Kitchen  + fern  +amnisure Prenatal labs: ABO, Rh:   Antibody:   Rubella:   RPR:    HBsAg:    HIV:    GBS:     Assessment/Plan: IUP at 29 4/7  PPROM -confirmed but normal AFI by [place] for CP prophylaxis Latency Abx (PCN allergic) so Clinda and zithromycin BMZ series NICU consult Chlamydia positive in 5/14 and treated.  Will recheck now.   GBS pending   Molly Savarino C 03/12/2013, 7:19 AM

## 2013-03-13 LAB — GC/CHLAMYDIA PROBE AMP
CT Probe RNA: NEGATIVE
GC Probe RNA: NEGATIVE

## 2013-03-13 NOTE — Progress Notes (Signed)
03/13/13 1500  Clinical Encounter Type  Visited With Patient  Visit Type Initial;Spiritual support;Social support  Referral From Nurse  Spiritual Encounters  Spiritual Needs Emotional  Stress Factors  Patient Stress Factors Loss of control;Major life changes (unexpected PROM/ante stay)   Visited with Lauren Diaz to introduce Spiritual Care and chaplain availability.  She reports good support and is working to adjust to unexpected admission and, now, likely early delivery.  We talked about ways that family and friends could support and encourage her while she is here.  Provided reflective listening, encouragement, and magazines for entertainment as she adjusts to hospitalization.  Will follow for support, but please also page as needs arise.  Thank you!  [address] [place], [place] [phone]

## 2013-03-13 NOTE — Progress Notes (Signed)
No complaints.  Scant LOF.  No VB.  +FM.  No CTX.  No f/c or abdominal soreness.    VSS.  AF FHT Cat I Toco flat  Gen: A&O x 3 Abd: gravid, soft, NT Ext: no c/c/e  22yo G2P0 at [redacted]w[redacted]d with PPROM -s/p Mag (discontinued this AM) -BMZ #2 given this AM -s/p NICU consult -D2 Clinda/Zith

## 2013-03-14 LAB — CULTURE, BETA STREP (GROUP B ONLY)

## 2013-03-14 MED ORDER — SODIUM CHLORIDE 0.9 % IJ SOLN
3.0000 mL | Freq: Two times a day (BID) | INTRAMUSCULAR | Status: DC
  Administered 2013-03-14 – 2013-03-15 (×4): 3 mL via INTRAVENOUS

## 2013-03-14 NOTE — Progress Notes (Signed)
Patient ID: Lauren Diaz, female   DOB: [birth year], 22 y.o.   MRN: [id number] S: MINIMAL LEAKAGE O: AF VSS     GRAVID UTERUS NONTENDER      FHR CAT ONE NO CTX'S      SONO VTX      ALREADY RECEIVED STEROIDS A: IUP AT 29.6 WITH PPROM P: EXP MANAGEMENT      MAG FOR LABOR BEFORE 32 WEEKS      STOP IV ANTIBIOTIC CONTINUE PO ZITHROMAX      HEPARIN LOCK      MONITOR Q SHIFT      DELIVER AT 34 WEEKS

## 2013-03-15 LAB — TYPE AND SCREEN

## 2013-03-15 NOTE — Progress Notes (Signed)
30 0/7 wks Occasional small leaks, no bleeding  VSS Afeb Uterus soft, NT  UCs about 1/hour, mild  A: PPROM     VTX by last U/S     P: S/P IV atb, continue po Zithromax      Expectant management      Magnesium for neuro protection if labor before 32 weeks

## 2013-03-15 NOTE — Plan of Care (Signed)
Problem: Consults Goal: Birthing Suites Patient Information Press F2 to bring up selections list   Pt < [redacted] weeks EGA     

## 2013-03-16 NOTE — Progress Notes (Signed)
Patient ID: Lauren Diaz, female   DOB: September 19, 1990, 22 y.o.   MRN: [id number] [redacted]w[redacted]d   S//  No new c/o  O// BP 99/45  Pulse 79  Temp(Src) 98.7 F (37.1 C) (Oral)  Resp 18  Ht 5\' 6"  (1.676 m)  Wt 195 lb (88.451 kg)  BMI 31.49 kg/m2  LMP 08/17/2012  FHR stable, abd soft   A+P//  [redacted]w[redacted]d, PPROM, stable / S/P BMZ

## 2013-03-17 NOTE — Progress Notes (Signed)
Patient ID: Lauren Diaz, female   DOB: [birth year], 22 y.o.   MRN: [id number] [redacted]w[redacted]d  S//  Min leaking  O//  BP 96/50  Pulse 89  Temp(Src) 98.7 F (37.1 C) (Oral)  Resp 18  Ht 5\' 6"  (1.676 m)  Wt 195 lb (88.451 kg)  BMI 31.49 kg/m2  LMP 08/17/2012  FHR stable  A+P//[redacted]w[redacted]d   PPROM, stable, almost finished latency ABX

## 2013-03-18 ENCOUNTER — Inpatient Hospital Stay (HOSPITAL_COMMUNITY): Payer: Federal, State, Local not specified - PPO

## 2013-03-18 NOTE — Progress Notes (Signed)
Patient ID: Lauren Diaz, female   DOB: 05-14-1991, 22 y.o.   MRN: [id number] [redacted]w[redacted]d  S// no new c/o  O// BP 97/57  Pulse 85  Temp(Src) 98.7 F (37.1 C) (Oral)  Resp 16  Ht 5\' 6"  (1.676 m)  Wt 195 lb (88.451 kg)  BMI 31.49 kg/m2  LMP 08/17/2012  FHR 142  A+P// PPROM @ [redacted]w[redacted]d, stable, discuss OPT mgmt w/ MFM

## 2013-03-19 LAB — TYPE AND SCREEN
ABO/RH(D): O POS
Antibody Screen: NEGATIVE

## 2013-03-19 NOTE — Progress Notes (Signed)
30 4/7 wks Leaking small amounts of clear fluid, no bleeding  VSS Afeb Uterus soft, NT  FHT + accels  U/S yesterday-vtx, AFI  16.1  A: PPROM  P: D/W patient options-she is afraid to go home now. Will continue present management

## 2013-03-19 NOTE — Progress Notes (Signed)
Ur chart review completed.  

## 2013-03-20 NOTE — Progress Notes (Signed)
S: Patient is resting. No complaints. Reports good fetal movement. Denies contractions. Leaking small amount of fluid  O:  Afebrile VSS General alert and oriented Lung CTAB Car RRR Abdomen is soft and non tender  IMPRESSION: IUP at 30 weeks 5 days PPROM  PLAN: Patient is very stable Continue expectant management.

## 2013-03-21 NOTE — Progress Notes (Signed)
Antenatal Nutrition Assessment:  Currently  30 6/[redacted] weeks gestation, with PROM. Height  66" Weight 190 lbs ( re-weighed in room, standing scale) pre-pregnancy weight 182 lbs.Pre-pregnancy  BMI 29.8  IBW 135 lbs  Total weight gain 8 lbs. Weight gain goals 15-20 lbs.   Estimated needs: 19-2100 kcal/day, 70-80 grams protein/day, 2.2 liters fluid/day  Antenatal regular diet tolerated well, appetite good. Current diet prescription will provide for increased needs.  No abnormal nutrition related labs  Nutrition Dx: Increased nutrient needs r/t pregnancy and fetal growth requirements aeb [redacted] weeks gestation.  No educational needs assessed at this time.  Elisabeth Cara M.Odis Luster LDN Neonatal Nutrition Support Specialist Pager [phone]

## 2013-03-21 NOTE — Progress Notes (Signed)
Ur chart review completed per request.  

## 2013-03-21 NOTE — Progress Notes (Signed)
Patient ID: Lauren Diaz, female   DOB: 1991/01/29, 22 y.o.   MRN: [id number]   [redacted]w[redacted]d  S// leaking some during the night  O// BP 96/49  Pulse 95  Temp(Src) 98.4 F (36.9 C) (Oral)  Resp 20  Ht 5\' 6"  (1.676 m)  Wt 195 lb (88.451 kg)  BMI 31.49 kg/m2  LMP 08/17/2012  FHR stable  US>>AFI 58%/vtx  A+P//PPROM @ [redacted]w[redacted]d, stable

## 2013-03-22 LAB — TYPE AND SCREEN: ABO/RH(D): O POS

## 2013-03-22 NOTE — Progress Notes (Signed)
Pt denies vb & lof.  Good fm.  Continues to have leaking, clear fluid.  No ctx.  AF, VSS Gen - NAD Abd - gravid, NT Ext NT  A/P:  PPROM Bedrest S/p BMZ, ABX, Mag Delivery at 34 w

## 2013-03-23 NOTE — Progress Notes (Signed)
Patient ID: Lauren Diaz, female   DOB: [birth year], 22 y.o.   MRN: [id number] S:  STILL SOME LEAKAGE O:  AF VSS       GRAVID UTERUS NONTENDER       FHR CAT ONE A:  IUP AT 31.1 WITH PROM P:  EXP MANAGEMENET

## 2013-03-24 NOTE — Progress Notes (Signed)
Patient ID: Lauren Diaz, female   DOB: 1991/04/20, 22 y.o.   MRN: [id number] IUP AT 31.2 WITH PROM STILL WITHLEAKAGE AF VSS GRAVID UTERUS NONTENDER CONTINUE EXP MANAGEMENT

## 2013-03-24 NOTE — Progress Notes (Signed)
Going downstairs for baby shower.

## 2013-03-24 NOTE — Progress Notes (Signed)
Back from baby shower

## 2013-03-24 NOTE — Progress Notes (Signed)
Patient ID: Lauren Diaz, female   DOB: [birth year], 22 y.o.   MRN: [id number] PUSHING FOR 3 HOURS DESCENT ARRESTED PRECEDE WITH PRIMARY CESAREAN SECTION Risk of cesarean section discussed.  These include:  Risk of infection;  Risk of hemorrhage that could require transfusions with the associated risk of aids or hepatitis;  Excessive bleeding could require hysterectomy;  Risk of injury to adjacent organs including bladder, bowel or ureters;  Risk of DVT's and possible pulmonary embolus.  Patient expresses a understanding of indications and risks.;

## 2013-03-25 LAB — TYPE AND SCREEN: ABO/RH(D): O POS

## 2013-03-25 NOTE — Progress Notes (Signed)
Patient ID: Lauren Diaz, female   DOB: July 07, 1990, 22 y.o.   MRN: [id number] Still with leakage Af vss Gravid uterus nontender fhr cat one Continue exp management Check with mfm tomorrow about possible d/c

## 2013-03-26 NOTE — Progress Notes (Signed)
Ur chart review completed.  

## 2013-03-26 NOTE — Progress Notes (Addendum)
Patient ID: Lauren Diaz, female   DOB: 1990/05/28, 22 y.o.   MRN: [id number] Pt without complaints Daily leaking only small amounts GFM   No ctxs  VSSAF FHR 140s Cat 1 Ctxs rare  Abd Gravid, nt Neg Homans bilaterally  PPROM at 52 4/7 MFM [place] and consult today SP BMZ, latency Abx, Mag for CP

## 2013-03-27 ENCOUNTER — Inpatient Hospital Stay (HOSPITAL_COMMUNITY): Payer: Federal, State, Local not specified - PPO

## 2013-03-27 NOTE — Progress Notes (Signed)
Pt reports no ctx or vb.  + FM.  + leaking clear fluid  AF, VSS  FHT + Abd - gravid, NT Toco none cvx deferred  PPROM Continue hospital bedrest - discussed plan of care with pt Pt agrees to inpatient mngt S/p BMZ, ABx and mag [place] for growth, position and AFI

## 2013-03-28 NOTE — Progress Notes (Signed)
No complaints. Scant LOF. No VB. +FM. No CTX. No f/c or abdominal soreness.   VSS. AF  FHT Cat I  Toco flat  U/S 11/4-AFI 16, nl growth, vtx  Gen: A&O x 3  Abd: gravid, soft, NT  Ext: no c/c/e   22yo G2P0 at [redacted]w[redacted]d with PPROM; stable  -s/p Mag and BMZ -s/p NICU consult  -s/p latency abx

## 2013-03-29 NOTE — Progress Notes (Signed)
Patient ID: Lauren Diaz, female   DOB: [birth year], 22 y.o.   MRN: [id number] [redacted]w[redacted]d  S// leaking some AF  O//BP 108/60  Pulse 108  Temp(Src) 99 F (37.2 C) (Oral)  Resp 18  Ht 5\' 6"  (1.676 m)  Wt 188 lb 4.8 oz (85.412 kg)  BMI 30.41 kg/m2  LMP 08/17/2012  Stable FHR  A+P//[redacted]w[redacted]d, PPROM, obsv

## 2013-03-30 NOTE — Progress Notes (Signed)
32 1/7 wks Leaking small amount of clear fluid daily, no bleeding  VSS Afeb Ut NT  FHR stable Vtx on last U/S  A: PPROM  P: Continue present care

## 2013-03-31 NOTE — Progress Notes (Signed)
32 2/7 wks No change, occasional small leak of fluid No bleeding  VSS Afeb Uterus soft, NT  FHT + accels  A: PROM P: Per Orders

## 2013-04-01 NOTE — Progress Notes (Signed)
32 3/7 weeks Small leaks through day, no gushes, no bleeding  VSS Afeb Uterus soft, NT  FHT + accels  A: PPROM  P: Continue present management

## 2013-04-02 NOTE — Progress Notes (Signed)
Ur chart review completed.  

## 2013-04-02 NOTE — Progress Notes (Signed)
No complaints.  Occ ctx with leaking, clear.  No vb or pain.  + FM  AF, VSS Gen - NAD Abd - gravid, NT  A/P:  PPROM S/p Mag, BMZ, Abx bedrest IOL at 34 weeks

## 2013-04-03 NOTE — Progress Notes (Signed)
Patient ID: Lauren Diaz, female   DOB: 22-Jun-1990, 22 y.o.   MRN: [id number] [redacted]w[redacted]d  S// no c/o  O//BP 90/47  Pulse 96  Temp(Src) 98.6 F (37 C) (Oral)  Resp 18  Ht 5\' 6"  (1.676 m)  Wt 188 lb 4.8 oz (85.412 kg)  BMI 30.41 kg/m2  LMP 08/17/2012  FHR stable  A+P//[redacted]w[redacted]d. PPROM>>induction @ 34

## 2013-04-04 MED ORDER — DOCUSATE SODIUM 50 MG/5ML PO LIQD
100.0000 mg | Freq: Every day | ORAL | Status: DC
  Administered 2013-04-05 – 2013-04-11 (×7): 100 mg via ORAL
  Filled 2013-04-04 (×9): qty 10

## 2013-04-04 NOTE — Progress Notes (Signed)
Patient is resting.Reports good fetal movement Denies contractions. Leaking only a small amount  Afebrile Vital signs stable General alert and oriented Abdomen is soft and non tender  FHR has been normal  IMPRESSION: IUP at 32 weeks and 6 days PPROM  PLAN: Continue expectant management Induction at 34 weeks

## 2013-04-05 NOTE — Progress Notes (Signed)
Pt off the monitor after reassurring FHR  

## 2013-04-05 NOTE — Progress Notes (Signed)
33 0/7 weeks  Small leaks , no bleeding +FM  VSS Afeb Uterus soft, NT  FHR + accels  A: PPROM  P: Continue Expectant Management

## 2013-04-06 LAB — CBC WITH DIFFERENTIAL/PLATELET
Basophils Relative: 0 % (ref 0–1)
Eosinophils Absolute: 0 10*3/uL (ref 0.0–0.7)
Eosinophils Relative: 0 % (ref 0–5)
HCT: 29.8 % — ABNORMAL LOW (ref 36.0–46.0)
Hemoglobin: 10.4 g/dL — ABNORMAL LOW (ref 12.0–15.0)
Lymphocytes Relative: 32 % (ref 12–46)
Lymphs Abs: 3.1 10*3/uL (ref 0.7–4.0)
MCH: 31.1 pg (ref 26.0–34.0)
MCV: 89.2 fL (ref 78.0–100.0)
Monocytes Absolute: 0.8 10*3/uL (ref 0.1–1.0)
Monocytes Relative: 9 % (ref 3–12)
Platelets: 148 10*3/uL — ABNORMAL LOW (ref 150–400)
RBC: 3.34 MIL/uL — ABNORMAL LOW (ref 3.87–5.11)
WBC: 9.7 10*3/uL (ref 4.0–10.5)

## 2013-04-06 NOTE — Progress Notes (Addendum)
No complaints. Scant LOF. No VB. +FM. No CTX. No f/c or abdominal soreness.   Tm 100.0 Tc 99.2  FHT Cat I  Toco flat   Gen: A&O x 3  Abd: gravid, soft, NT  Ext: no c/c/e   22yo G2P0 at [redacted]w[redacted]d with PPROM; stable  -s/p Mag and BMZ  -s/p NICU consult  -s/p latency abx -Deliver at 34 weeks -CBC with diff

## 2013-04-06 NOTE — Progress Notes (Signed)
Pt off the monitor after reassurring FHR  

## 2013-04-06 NOTE — Progress Notes (Signed)
CSW met with pt briefly to address the # of overnight visitors allowed after other pt's complained about the noise level coming from the room.  Pt agrees to have 1 overnight support person, effectively immediately.  CSW provided one of pt's visitors with a bus pass, since transportation was identified as an issue.  CSW signing off. Please reconsult if needed.

## 2013-04-07 NOTE — Progress Notes (Signed)
No complaints. Scant LOF. No VB. +FM. No CTX. No f/c or abdominal soreness.   VSS.  AF  WBC wnl yesterday (9.7) FHT Cat I  Toco flat  Gen: A&O x 3  Abd: gravid, soft, NT  Ext: no c/c/e   22yo G2P0 at [redacted]w[redacted]d with PPROM; stable  -s/p Mag and BMZ  -s/p NICU consult  -s/p latency abx  -Deliver at 34 weeks

## 2013-04-08 NOTE — Progress Notes (Signed)
No complaints. Scant LOF. No VB. +FM. No CTX. No f/c or abdominal soreness.   VSS. AF  FHT Cat I  Toco flat   Gen: A&O x 3  Abd: gravid, soft, NT  Ext: no c/c/e   22yo G2P0 at [redacted]w[redacted]d with PPROM; stable  -s/p Mag and BMZ  -s/p NICU consult  -s/p latency abx  -Deliver at 34 weeks

## 2013-04-09 NOTE — Progress Notes (Signed)
Pt sitting up in the bed eating dinner

## 2013-04-09 NOTE — Progress Notes (Signed)
Ur chart review completed.  

## 2013-04-09 NOTE — Progress Notes (Signed)
[redacted]w[redacted]d   S// no change  O// BP 115/65  Pulse 121  Temp(Src) 99.2 F (37.3 C) (Oral)  Resp 18  Ht 5\' 6"  (1.676 m)  Wt 188 lb 8 oz (85.503 kg)  BMI 30.44 kg/m2  LMP 08/17/2012  Stable FHR  A+P// [redacted]w[redacted]d, PPROM, induce @ 34

## 2013-04-10 NOTE — Progress Notes (Signed)
33 5/7 weeks No changes  VSS Afeb FHT reactive  A: PPROM  P: Induction at 34 weeks

## 2013-04-11 NOTE — Progress Notes (Signed)
Patient ID: Lauren Diaz, female   DOB: 1991-03-06, 22 y.o.   MRN: [id number] AF VSS STILL WITH LEAKAGE GOOD FM  UTERUS NONTENDER FHR TRACING CAT ONE IUP AT 36.6 WITH PROM INDUCE THIS WEEKEND

## 2013-04-12 ENCOUNTER — Inpatient Hospital Stay (HOSPITAL_COMMUNITY): Admit: 2013-04-12 | Payer: Federal, State, Local not specified - PPO

## 2013-04-12 LAB — CBC
HCT: 36 % (ref 36.0–46.0)
Hemoglobin: 12.5 g/dL (ref 12.0–15.0)
MCH: 31.2 pg (ref 26.0–34.0)
MCV: 89.8 fL (ref 78.0–100.0)
RBC: 4.01 MIL/uL (ref 3.87–5.11)

## 2013-04-12 MED ORDER — IBUPROFEN 600 MG PO TABS: 600.0000 mg | ORAL_TABLET | Freq: Four times a day (QID) | ORAL | Status: DC | PRN

## 2013-04-12 MED ORDER — OXYTOCIN 40 UNITS IN LACTATED RINGERS INFUSION - SIMPLE MED: 62.5000 mL/h | INTRAVENOUS | Status: DC

## 2013-04-12 MED ORDER — LACTATED RINGERS IV SOLN
INTRAVENOUS | Status: DC
  Administered 2013-04-12: 500 mL/h via INTRAVENOUS

## 2013-04-12 MED ORDER — LIDOCAINE HCL (PF) 1 % IJ SOLN: 30.0000 mL | INTRAMUSCULAR | Status: DC | PRN

## 2013-04-12 MED ORDER — FLEET ENEMA 7-19 GM/118ML RE ENEM: 1.0000 | ENEMA | RECTAL | Status: DC | PRN

## 2013-04-12 MED ORDER — TERBUTALINE SULFATE 1 MG/ML IJ SOLN: 0.2500 mg | Freq: Once | INTRAMUSCULAR | Status: AC | PRN

## 2013-04-12 MED ORDER — ACETAMINOPHEN 325 MG PO TABS: 650.0000 mg | ORAL_TABLET | ORAL | Status: DC | PRN

## 2013-04-12 MED ORDER — CITRIC ACID-SODIUM CITRATE 334-500 MG/5ML PO SOLN: 30.0000 mL | ORAL | Status: DC | PRN

## 2013-04-12 MED ORDER — OXYTOCIN BOLUS FROM INFUSION: 500.0000 mL | INTRAVENOUS | Status: DC

## 2013-04-12 MED ORDER — MISOPROSTOL 25 MCG QUARTER TABLET
25.0000 ug | ORAL_TABLET | ORAL | Status: DC
  Administered 2013-04-12 – 2013-04-14 (×7): 25 ug via VAGINAL
  Filled 2013-04-12: qty 0.25
  Filled 2013-04-12 (×3): qty 1
  Filled 2013-04-12: qty 0.25
  Filled 2013-04-12: qty 1
  Filled 2013-04-12 (×5): qty 0.25

## 2013-04-12 MED ORDER — LACTATED RINGERS IV SOLN: 500.0000 mL | INTRAVENOUS | Status: DC | PRN

## 2013-04-12 MED ORDER — ONDANSETRON HCL 4 MG/2ML IJ SOLN: 4.0000 mg | Freq: Four times a day (QID) | INTRAMUSCULAR | Status: DC | PRN

## 2013-04-12 MED ORDER — OXYCODONE-ACETAMINOPHEN 5-325 MG PO TABS: 1.0000 | ORAL_TABLET | ORAL | Status: DC | PRN

## 2013-04-12 NOTE — Progress Notes (Signed)
S: Patient denies contractions. Is still leaking moderate amount  O: Afebrile Vital signs stable Ultrasound at bedside - vertex  Cervix is thick/closed /high /posterior  IMPRESSION: PPROM  PLAN: Per MFM recommendations - delivery at 34 weeks Plan to transfer to L and D tonight for probable Pitocin induction Risks of pitocin/induction reviewed with the patient

## 2013-04-13 ENCOUNTER — Encounter (HOSPITAL_COMMUNITY): Payer: Self-pay | Admitting: *Deleted

## 2013-04-13 MED ORDER — OXYCODONE-ACETAMINOPHEN 5-325 MG PO TABS: 1.0000 | ORAL_TABLET | ORAL | Status: DC | PRN

## 2013-04-13 MED ORDER — EPHEDRINE 5 MG/ML INJ
10.0000 mg | INTRAVENOUS | Status: DC | PRN
  Filled 2013-04-13: qty 4
  Filled 2013-04-13: qty 2

## 2013-04-13 MED ORDER — OXYTOCIN 40 UNITS IN LACTATED RINGERS INFUSION - SIMPLE MED: 62.5000 mL/h | INTRAVENOUS | Status: DC

## 2013-04-13 MED ORDER — OXYTOCIN BOLUS FROM INFUSION: 500.0000 mL | INTRAVENOUS | Status: DC

## 2013-04-13 MED ORDER — DIPHENHYDRAMINE HCL 50 MG/ML IJ SOLN: 12.5000 mg | INTRAMUSCULAR | Status: DC | PRN

## 2013-04-13 MED ORDER — ONDANSETRON HCL 4 MG/2ML IJ SOLN
4.0000 mg | Freq: Four times a day (QID) | INTRAMUSCULAR | Status: DC | PRN
  Administered 2013-04-14: 4 mg via INTRAVENOUS
  Filled 2013-04-13 (×2): qty 2

## 2013-04-13 MED ORDER — ACETAMINOPHEN 325 MG PO TABS: 650.0000 mg | ORAL_TABLET | ORAL | Status: DC | PRN

## 2013-04-13 MED ORDER — LIDOCAINE HCL (PF) 1 % IJ SOLN
30.0000 mL | INTRAMUSCULAR | Status: DC | PRN
  Filled 2013-04-13: qty 30

## 2013-04-13 MED ORDER — LACTATED RINGERS IV SOLN
INTRAVENOUS | Status: DC
  Administered 2013-04-13 – 2013-04-14 (×4): via INTRAVENOUS

## 2013-04-13 MED ORDER — LACTATED RINGERS IV SOLN: 500.0000 mL | Freq: Once | INTRAVENOUS | Status: DC

## 2013-04-13 MED ORDER — FLEET ENEMA 7-19 GM/118ML RE ENEM: 1.0000 | ENEMA | RECTAL | Status: DC | PRN

## 2013-04-13 MED ORDER — PHENYLEPHRINE 40 MCG/ML (10ML) SYRINGE FOR IV PUSH (FOR BLOOD PRESSURE SUPPORT)
80.0000 ug | PREFILLED_SYRINGE | INTRAVENOUS | Status: DC | PRN
  Filled 2013-04-13: qty 2
  Filled 2013-04-13: qty 10

## 2013-04-13 MED ORDER — IBUPROFEN 600 MG PO TABS: 600.0000 mg | ORAL_TABLET | Freq: Four times a day (QID) | ORAL | Status: DC | PRN

## 2013-04-13 MED ORDER — CITRIC ACID-SODIUM CITRATE 334-500 MG/5ML PO SOLN: 30.0000 mL | ORAL | Status: DC | PRN

## 2013-04-13 MED ORDER — LACTATED RINGERS IV SOLN: 500.0000 mL | INTRAVENOUS | Status: DC | PRN

## 2013-04-13 MED ORDER — EPHEDRINE 5 MG/ML INJ
10.0000 mg | INTRAVENOUS | Status: DC | PRN
  Filled 2013-04-13: qty 2

## 2013-04-13 MED ORDER — PHENYLEPHRINE 40 MCG/ML (10ML) SYRINGE FOR IV PUSH (FOR BLOOD PRESSURE SUPPORT)
80.0000 ug | PREFILLED_SYRINGE | INTRAVENOUS | Status: DC | PRN
  Filled 2013-04-13: qty 2

## 2013-04-13 MED ORDER — FENTANYL 2.5 MCG/ML BUPIVACAINE 1/10 % EPIDURAL INFUSION (WH - ANES)
14.0000 mL/h | INTRAMUSCULAR | Status: DC | PRN
  Administered 2013-04-14 (×2): 14 mL/h via EPIDURAL
  Filled 2013-04-13 (×2): qty 125

## 2013-04-13 NOTE — Progress Notes (Signed)
04/13/13 1300  Clinical Encounter Type  Visited With Patient and family together (boyfriend "Junior" and his sister)  Visit Type Follow-up;Spiritual support;Social support  Spiritual Encounters  Spiritual Needs Brewing technologist was welcoming of pastoral presence on this follow-up visit, noting that she's doing "much better" than when we first met, because her pregnancy has progressed and there is less anxious uncertainty.  Per pt, she has a doula from the [facility] who will come later to support her through the birthing process.  Encouraged Hospital doctor and family to ask questions and seek support as desired, reminding them of chaplain availability through the weekend.  Provided reflective listening, affirmation of the significance of moving toward delivery and more active parenthood, and encouragement in the transition.  Spiritual Care will continue to follow, but please also page as needs arise:  [phone].  [address] [place], [place] [phone]

## 2013-04-13 NOTE — Progress Notes (Signed)
Pt resting comfortably.  Denies ctx and vb.    FHT reassuring Toco irritability Cvx 1cm per RN exam  A/P:  PPROM, 34 wks, IOL Continue cytotec

## 2013-04-13 NOTE — Progress Notes (Signed)
Patients IV was removed per patient request late this afternoon per off going nurse report. Called Dr. Renaldo Fiddler to discuss IV order and care plan. Dr. Renaldo Fiddler requires patient to have IV prior to next Cytotec dose. Would like dosing schedule to be 2200 and 0200AM, start Pitocin at 6:30 AM. Will explain to patient.

## 2013-04-14 ENCOUNTER — Encounter (HOSPITAL_COMMUNITY): Payer: Self-pay | Admitting: *Deleted

## 2013-04-14 ENCOUNTER — Encounter (HOSPITAL_COMMUNITY): Payer: Federal, State, Local not specified - PPO | Admitting: Anesthesiology

## 2013-04-14 ENCOUNTER — Inpatient Hospital Stay (HOSPITAL_COMMUNITY): Payer: Federal, State, Local not specified - PPO | Admitting: Anesthesiology

## 2013-04-14 MED ORDER — BUTORPHANOL TARTRATE 1 MG/ML IJ SOLN
1.0000 mg | INTRAMUSCULAR | Status: DC | PRN
  Administered 2013-04-14: 0.5 mg via INTRAVENOUS

## 2013-04-14 MED ORDER — SIMETHICONE 80 MG PO CHEW: 80.0000 mg | CHEWABLE_TABLET | ORAL | Status: DC | PRN

## 2013-04-14 MED ORDER — BENZOCAINE-MENTHOL 20-0.5 % EX AERO: 1.0000 "application " | INHALATION_SPRAY | CUTANEOUS | Status: DC | PRN

## 2013-04-14 MED ORDER — DIBUCAINE 1 % RE OINT: 1.0000 "application " | TOPICAL_OINTMENT | RECTAL | Status: DC | PRN

## 2013-04-14 MED ORDER — LACTATED RINGERS IV SOLN
500.0000 mL | Freq: Once | INTRAVENOUS | Status: AC
  Administered 2013-04-14: 10:00:00 via INTRAVENOUS

## 2013-04-14 MED ORDER — DIPHENHYDRAMINE HCL 25 MG PO CAPS: 25.0000 mg | ORAL_CAPSULE | Freq: Four times a day (QID) | ORAL | Status: DC | PRN

## 2013-04-14 MED ORDER — PRENATAL MULTIVITAMIN CH
1.0000 | ORAL_TABLET | Freq: Every day | ORAL | Status: DC
  Filled 2013-04-14: qty 1

## 2013-04-14 MED ORDER — CLINDAMYCIN PHOSPHATE 900 MG/50ML IV SOLN
900.0000 mg | Freq: Three times a day (TID) | INTRAVENOUS | Status: DC
  Administered 2013-04-14 (×2): 900 mg via INTRAVENOUS
  Filled 2013-04-14 (×3): qty 50

## 2013-04-14 MED ORDER — TERBUTALINE SULFATE 1 MG/ML IJ SOLN: 0.2500 mg | Freq: Once | INTRAMUSCULAR | Status: DC | PRN

## 2013-04-14 MED ORDER — MEDROXYPROGESTERONE ACETATE 150 MG/ML IM SUSP: 150.0000 mg | INTRAMUSCULAR | Status: DC | PRN

## 2013-04-14 MED ORDER — BUTORPHANOL TARTRATE 1 MG/ML IJ SOLN
INTRAMUSCULAR | Status: AC
  Administered 2013-04-14: 0.5 mg via INTRAVENOUS
  Filled 2013-04-14: qty 1

## 2013-04-14 MED ORDER — MEASLES, MUMPS & RUBELLA VAC ~~LOC~~ INJ
0.5000 mL | INJECTION | Freq: Once | SUBCUTANEOUS | Status: DC
  Filled 2013-04-14: qty 0.5

## 2013-04-14 MED ORDER — ONDANSETRON HCL 4 MG/2ML IJ SOLN: 4.0000 mg | INTRAMUSCULAR | Status: DC | PRN

## 2013-04-14 MED ORDER — OXYCODONE-ACETAMINOPHEN 5-325 MG PO TABS: 1.0000 | ORAL_TABLET | ORAL | Status: DC | PRN

## 2013-04-14 MED ORDER — ONDANSETRON HCL 4 MG PO TABS: 4.0000 mg | ORAL_TABLET | ORAL | Status: DC | PRN

## 2013-04-14 MED ORDER — TETANUS-DIPHTH-ACELL PERTUSSIS 5-2.5-18.5 LF-MCG/0.5 IM SUSP: 0.5000 mL | Freq: Once | INTRAMUSCULAR | Status: DC

## 2013-04-14 MED ORDER — WITCH HAZEL-GLYCERIN EX PADS: 1.0000 "application " | MEDICATED_PAD | CUTANEOUS | Status: DC | PRN

## 2013-04-14 MED ORDER — SENNOSIDES-DOCUSATE SODIUM 8.6-50 MG PO TABS
2.0000 | ORAL_TABLET | ORAL | Status: DC
  Filled 2013-04-14: qty 2

## 2013-04-14 MED ORDER — IBUPROFEN 600 MG PO TABS
600.0000 mg | ORAL_TABLET | Freq: Four times a day (QID) | ORAL | Status: DC
  Filled 2013-04-14 (×2): qty 1

## 2013-04-14 MED ORDER — OXYTOCIN 40 UNITS IN LACTATED RINGERS INFUSION - SIMPLE MED
1.0000 m[IU]/min | INTRAVENOUS | Status: DC
  Administered 2013-04-14: 2 m[IU]/min via INTRAVENOUS
  Filled 2013-04-14: qty 1000

## 2013-04-14 MED ORDER — LIDOCAINE HCL (PF) 1 % IJ SOLN
INTRAMUSCULAR | Status: DC | PRN
  Administered 2013-04-14 (×2): 5 mL

## 2013-04-14 MED ORDER — LANOLIN HYDROUS EX OINT: TOPICAL_OINTMENT | CUTANEOUS | Status: DC | PRN

## 2013-04-14 NOTE — Consult Note (Signed)
Neonatology Note:  Attendance at Delivery:  I was asked by Dr. Adkins to attend this NSVD at 34 3/[redacted] weeks GA following induction for PPROM. The mother is a G2P0A1 O pos, GBS neg with SROM on 10/19, fluid clear. She got a course of Betamethasone, Magnesium sulfate for neuro prophylaxis, and latency antibiotics. Weekly ultrasound showed adequate amniotic fluid and normal fetal growth; the last one was on 11/4. Induction was started at 34 weeks. The mother was given Clindamycin beginning about 12 hours before delivery; she was afebrile during labor. There was a tight CAN times 2 which required ligation on the perineum. Infant had fair tone and cried after bulb suctioning. He had a good HR and color. By 2 minutes, I noted that his air exchange was minimal, although his HR remained normal; his color was less pink, however. We placed a pulse oximeter and put the neopuff on him. His O2 saturations fluctuated between 80-90% on 30-40% FIO2, but over the next several minutes, we had to increase the FIO2 gradually to 100% in order to maintain normal O2 saturations. The baby was beginning to have retractions and air exchange was still not very good, so I intubated him atraumatically with a 3.0 mm ETT at 10 minutes of life. The CO2 detector turned yellow immediately. His color improved, HR increased (had always been > 100), and chest excursion was much improved after intubation. We secured the tube at 9 cm at the lip. Ap 7/6. He was seen briefly by his mother in the DR, then was transported to the NICU, being bagged via ETT, on about 30-40% FIO2. His father was in attendance.  Taneia Mealor C. Daija Routson, MD  

## 2013-04-14 NOTE — Anesthesia Preprocedure Evaluation (Signed)

## 2013-04-14 NOTE — Progress Notes (Signed)
Pt comfortable w/ epidural  FHT reassuring w/ accels Toco Q2 Cvx 5/70/-2 AROM forebag again, clear/blood tinged fluid  A/P:  Continue pitocin Epidural clindamycin IV

## 2013-04-14 NOTE — Progress Notes (Signed)
Pt comfortable with epidural  FHT reassuring, cat 1 Toco Q2-6 Cvx 5/70/-2 AROM forebag, clear IUPC placed  A/P:  Continue pitocin Exp mngt

## 2013-04-14 NOTE — Progress Notes (Signed)
SVD of viable female infant. Tight nuchal cord x 2 clamped and cut at perineum. NICU team present and assumed care of infant immediately after delivery Placenta delivered spontaneous w/ 3VC.  Sent to pathology No lacerations.  Fundus firm.  EBL 300cc .

## 2013-04-14 NOTE — Progress Notes (Signed)
Cervical foley placed without resistance.  60cc placed in foley balloon. Pt tolerated with moderate cramping, stadol IV ordered

## 2013-04-14 NOTE — Anesthesia Procedure Notes (Signed)
Epidural Patient location during procedure: OB Start time: 04/14/2013 10:15 AM  Staffing Anesthesiologist: Brayton Caves Performed by: anesthesiologist   Preanesthetic Checklist Completed: patient identified, site marked, surgical consent, pre-op evaluation, timeout performed, IV checked, risks and benefits discussed and monitors and equipment checked  Epidural Patient position: sitting Prep: site prepped and draped and DuraPrep Patient monitoring: continuous pulse ox and blood pressure Approach: midline Injection technique: LOR air  Needle:  Needle type: Tuohy  Needle gauge: 17 G Needle length: 9 cm and 9 Needle insertion depth: 5 cm cm Catheter type: closed end flexible Catheter size: 19 Gauge Catheter at skin depth: 10 cm Test dose: negative  Assessment Events: blood not aspirated, injection not painful, no injection resistance, negative IV test and no paresthesia  Additional Notes Patient identified.  Risk benefits discussed including failed block, incomplete pain control, headache, nerve damage, paralysis, blood pressure changes, nausea, vomiting, reactions to medication both toxic or allergic, and postpartum back pain.  Patient expressed understanding and wished to proceed.  All questions were answered.  Sterile technique used throughout procedure and epidural site dressed with sterile barrier dressing. No paresthesia or other complications noted.The patient did not experience any signs of intravascular injection such as tinnitus or metallic taste in mouth nor signs of intrathecal spread such as rapid motor block. Please see nursing notes for vital signs.

## 2013-04-14 NOTE — Progress Notes (Signed)
FHR intermittent from pt in exaggerated sims and pt moves from right to left every 30 minutes to promote descent.. Cardio adjusted several times.

## 2013-04-14 NOTE — Progress Notes (Signed)
Pt feeling increased pressure.  FHT reassuring Toco Q2 Cvx c/c/+2   A/P: will start pushing NICU to be called for delivery

## 2013-04-14 NOTE — Progress Notes (Signed)
Pt denies complaints and vb.  Continues to have bleeding.    FHT reassuring, cat 1 Toco occasional Cvx 2/60/-2  Plan:  Will try insertion of cervical foley Pt agrees with plan, requsts iv pain meds

## 2013-04-14 NOTE — Progress Notes (Signed)
Foley out, cvx 6cm per RN exam Comfortable w/ epidural FHT reassuring Toco occasional  A/P:  Exp mngt

## 2013-04-15 LAB — CBC
HCT: 28 % — ABNORMAL LOW (ref 36.0–46.0)
MCV: 89.5 fL (ref 78.0–100.0)
RBC: 3.13 MIL/uL — ABNORMAL LOW (ref 3.87–5.11)
RDW: 13.2 % (ref 11.5–15.5)
WBC: 19.9 10*3/uL — ABNORMAL HIGH (ref 4.0–10.5)

## 2013-04-15 MED ORDER — IBUPROFEN 100 MG/5ML PO SUSP
600.0000 mg | Freq: Four times a day (QID) | ORAL | Status: DC
  Administered 2013-04-15 – 2013-04-16 (×8): 600 mg via ORAL
  Filled 2013-04-15 (×11): qty 30

## 2013-04-15 MED ORDER — DOCUSATE SODIUM 50 MG/5ML PO LIQD
100.0000 mg | Freq: Every day | ORAL | Status: DC
  Administered 2013-04-15 – 2013-04-16 (×2): 100 mg via ORAL
  Filled 2013-04-15 (×3): qty 10

## 2013-04-15 MED ORDER — COMPLETENATE 29-1 MG PO CHEW
1.0000 | CHEWABLE_TABLET | Freq: Every day | ORAL | Status: DC
  Administered 2013-04-15 – 2013-04-16 (×2): 1 via ORAL
  Filled 2013-04-15 (×3): qty 1

## 2013-04-15 NOTE — Progress Notes (Signed)
Post Partum Day 1 Subjective: no complaints, up ad lib and tolerating PO  Objective: Blood pressure 117/50, pulse 71, temperature 98.4 F (36.9 C), temperature source Oral, resp. rate 18, height 5\' 6"  (1.676 m), weight 89.812 kg (198 lb), last menstrual period 08/17/2012, SpO2 99.00%, unknown if currently breastfeeding.  Physical Exam:  General: alert and cooperative Lochia: appropriate Uterine Fundus: firm Incision: n/a DVT Evaluation: No evidence of DVT seen on physical exam.   Recent Labs  04/12/13 1845 04/15/13 0459  HGB 12.5 9.7*  HCT 36.0 28.0*    Assessment/Plan: Plan for discharge tomorrow and Social Work consult   LOS: 35 days   Jazlin Tapscott 04/15/2013, 9:10 AM

## 2013-04-15 NOTE — Anesthesia Postprocedure Evaluation (Signed)
Anesthesia Post Note  Patient: Chiropodist  Procedure(s) Performed: * No procedures listed *  Anesthesia type: Epidural  Patient location: Mother/Baby  Post pain: Pain level controlled  Post assessment: Post-op Vital signs reviewed  Last Vitals:  Filed Vitals:   04/15/13 1125  BP: 104/73  Pulse: 77  Temp: 36.9 C  Resp: 18    Post vital signs: Reviewed  Level of consciousness:alert  Complications: No apparent anesthesia complications

## 2013-04-16 ENCOUNTER — Ambulatory Visit: Payer: Self-pay

## 2013-04-16 MED ORDER — COMPLETENATE 29-1 MG PO CHEW: 1.0000 | CHEWABLE_TABLET | Freq: Every day | ORAL | Status: DC

## 2013-04-16 MED ORDER — IBUPROFEN 100 MG/5ML PO SUSP: 600.0000 mg | Freq: Four times a day (QID) | ORAL | Status: DC

## 2013-04-16 NOTE — Discharge Summary (Signed)
Obstetric Discharge Summary Reason for Admission: PPROM  Prenatal Procedures: ultrasound Intrapartum Procedures: spontaneous vaginal delivery Postpartum Procedures: none Complications-Operative and Postpartum: none Hemoglobin  Date Value Range Status  04/15/2013 9.7* 12.0 - 15.0 g/dL Final     HCT  Date Value Range Status  04/15/2013 28.0* 36.0 - 46.0 % Final    Physical Exam:  General: alert and cooperative Lochia: appropriate Uterine Fundus: firm Incision: perineum intact DVT Evaluation: No evidence of DVT seen on physical exam. Negative Homan's sign. No cords or calf tenderness. No significant calf/ankle edema.  Discharge Diagnoses: spont vag delivery at 33 weeks  Discharge Information: Date: 04/16/2013 Activity: pelvic rest Diet: routine Medications: PNV and Ibuprofen Condition: stable Instructions: refer to practice specific booklet Discharge to: home   Newborn Data: Live born female  Birth Weight: 4 lb 15.7 oz (2259 g) APGAR: 7, 6  Home with NICU.  CURTIS,CAROL G 04/16/2013, 8:52 AM

## 2013-04-16 NOTE — Progress Notes (Signed)
Pt is discharged in the care of parents Downstairs with N.T. Escort. Stable. Discharge instruction  withRxx were given to pt. With good understanding. Questions were asked and answered. Denies hea vy vaginal bleeding. Lauren Diaz

## 2013-04-16 NOTE — Lactation Note (Addendum)
This note was copied from the chart of Lauren [employer]. Lactation Consultation Note    Follow up consult with this mom of a NICU baby, 34 4/[redacted] weeks gestation today, and 38 hours post partum. Mom has not been very compliant with pumping ,. and is being discharged to home today. She was instructed in the use of a hand pump. Discharge DEP teaching done with mom - NICU booklet on providing EBm for a NICU baby done with mom. After expressing 0.5 mls fo colostrum for her baby, mom seemed more motivated to continue trying  She and baby will be followed in the NICU. Mom needs to apply to [facility]  Patient Name: Lauren Diaz [id number] Date: 04/16/2013     Maternal Data    Feeding Feeding Type: Bottle Fed - Formula Nipple Type: Slow - flow Length of feed: 8 min  LATCH Score/Interventions                      Lactation Tools Discussed/Used     Consult Status      Alfred Levins 04/16/2013, 4:41 PM

## 2013-04-17 ENCOUNTER — Ambulatory Visit: Payer: Self-pay

## 2013-04-17 NOTE — Progress Notes (Signed)
Clinical Social Work Department PSYCHOSOCIAL ASSESSMENT - MATERNAL/CHILD 04/16/2013  Patient:  Lauren Diaz, Lauren Diaz  Account Number:  [id number]  Admit Date:  03/11/2013  Marjo Bicker Name:   Lollie Sails    Clinical Social Worker:  Lulu Riding, LCSW   Date/Time:  04/16/2013 02:00 PM  Date Referred:  04/16/2013   Referral source  NICU     Referred reason  NICU   Other referral source:    I:  FAMILY / HOME ENVIRONMENT Child's legal guardian:  PARENT  Guardian - Name Guardian - Age Guardian - Address  Lauren Diaz [address] [place], [place] [address]  Marlin Canary     Other household support members/support persons Name Relationship DOB   FATHER    Other support:   MOB states she has a good support system of family and friends.  She states FOB is involved and supportive, but they do not live together.  MGF and FOB's sister were here today with her.    II  PSYCHOSOCIAL DATA Information Source:  Patient Interview  Event organiser Employment:   MOB works at [employer] and FOB works at [employer].   Financial resources:  Media planner If [employer] - [place]:    School / Grade:   Maternity Care Coordinator / Child Services Coordination / Early Interventions:  Cultural issues impacting care:   None stated    III  STRENGTHS Strengths  Adequate Resources  Compliance with medical plan  Home prepared for Child (including basic supplies)  Other - See comment  Supportive family/friends  Understanding of illness   Strength comment:  Pediatric follow up will be at [facility] Pediatricians-Dr. Janee Morn   IV  RISK FACTORS AND CURRENT PROBLEMS Current Problem:  None   Risk Factor & Current Problem Patient Issue Family Issue Risk Factor / Current Problem Comment   N N     V  SOCIAL WORK ASSESSMENT CSW met with MOB in her third floor room to introduce myself and complete assessment due to NICU admission.  MOB was very pleasant  and welcomed CSW into her room despite having company and stated we could talk with her visitors present.  She states she and baby are doing well.  She reports having everything she needs for baby at home and a great support system.  She states she "does not know how" she got through a month in the Antenatal unit, but is glad it is behind her and baby is healthy.  She seems to have a good understanding of baby's medical situation and appears to be coping well at this time.  She states no emotional concerns.  CSW discussed signs and symptoms of PPD to watch for and asked her to call CSW or her doctor if she has concerns at any time.  CSW provided her with contact information and has no social concerns at this time.  MOB states no questions, concerns, or needs at this time and thanked CSW for the visit.      VI SOCIAL WORK PLAN Social Work Plan  Psychosocial Support/Ongoing Assessment of Needs   Type of pt/family education:   PPD signs and symptoms  Ongoing support services offered by NICU CSW.   If child protective services report - county:   If child protective services report - date:   Information/referral to community resources comment:   No referrral needs noted at this time.   Other social work plan:

## 2013-04-17 NOTE — Lactation Note (Signed)
This note was copied from the chart of Lauren [employer]. Lactation Consultation Note   Follow up consult with this mom and baby, now 62 hours post partum, and 34 5/7 weeks corrected gestation. i assisted mom with  latching baby for the first time, during an ng feed.  The baby latched well, and suckled strongly for a few minutes, and then unlatched. Mom held the baby skin to skin for the remainder of the feeding. She seemed happy with breast feeding. Mom used the NICU dep after skin to skin. Mom reports the hand pump not working well last night. I encouraged mom to come up with the 30$ for a loaner DEP, especially since her milk will begin transitioning in very soon. I will follow this family in the NICU.  Patient Name: Lauren Diaz Today's Date: 04/17/2013     Maternal Data    Feeding Feeding Type: Formula Length of feed: 20 min  LATCH Score/Interventions Latch: Grasps breast easily, tongue down, lips flanged, rhythmical sucking.  Audible Swallowing: None Intervention(s): Skin to skin;Hand expression  Type of Nipple: Everted at rest and after stimulation  Comfort (Breast/Nipple): Soft / non-tender     Hold (Positioning): Assistance needed to correctly position infant at breast and maintain latch. Intervention(s): Breastfeeding basics reviewed;Support Pillows;Position options;Skin to skin  LATCH Score: 7  Lactation Tools Discussed/Used     Consult Status Consult Status: PRN Follow-up type:  (in NICU)    Alfred Levins 04/17/2013, 2:57 PM

## 2013-05-10 ENCOUNTER — Emergency Department (HOSPITAL_COMMUNITY)
Admission: EM | Admit: 2013-05-10 | Discharge: 2013-05-11 | Disposition: A | Discharge status: Home / Self Care | Payer: Federal, State, Local not specified - PPO | Attending: Emergency Medicine | Admitting: Emergency Medicine

## 2013-05-10 ENCOUNTER — Encounter (HOSPITAL_COMMUNITY): Payer: Self-pay | Admitting: Emergency Medicine

## 2013-05-10 DIAGNOSIS — R000 Tachycardia, unspecified: Secondary | ICD-10-CM | POA: Insufficient documentation

## 2013-05-10 DIAGNOSIS — Z79899 Other long term (current) drug therapy: Secondary | ICD-10-CM | POA: Insufficient documentation

## 2013-05-10 DIAGNOSIS — E876 Hypokalemia: Secondary | ICD-10-CM | POA: Insufficient documentation

## 2013-05-10 DIAGNOSIS — Z3202 Encounter for pregnancy test, result negative: Secondary | ICD-10-CM | POA: Insufficient documentation

## 2013-05-10 DIAGNOSIS — Z8619 Personal history of other infectious and parasitic diseases: Secondary | ICD-10-CM | POA: Insufficient documentation

## 2013-05-10 DIAGNOSIS — N10 Acute tubulo-interstitial nephritis: Secondary | ICD-10-CM

## 2013-05-10 DIAGNOSIS — R509 Fever, unspecified: Secondary | ICD-10-CM | POA: Insufficient documentation

## 2013-05-10 DIAGNOSIS — Z87891 Personal history of nicotine dependence: Secondary | ICD-10-CM | POA: Insufficient documentation

## 2013-05-10 DIAGNOSIS — R112 Nausea with vomiting, unspecified: Secondary | ICD-10-CM | POA: Insufficient documentation

## 2013-05-10 DIAGNOSIS — Z880 Allergy status to penicillin: Secondary | ICD-10-CM | POA: Insufficient documentation

## 2013-05-10 NOTE — ED Notes (Signed)
Patient reports back pain that radiates into right lower quad. Per patient fevers and blood in urine yesterday with highest fever of 103. Patient reports calling PCP and getting antibiotic prescribed. Patient reports taking 2 doses. Unsure of name of antibiotic. Patient states pain improved after antibiotics and she did not note anymore blood in urine. Per patient started vomiting today and continued to have fevers. Per patient tool ibuprofen at 9am this morning.

## 2013-05-11 LAB — CBC WITH DIFFERENTIAL/PLATELET
Basophils Relative: 0 % (ref 0–1)
HCT: 37.5 % (ref 36.0–46.0)
Hemoglobin: 12.7 g/dL (ref 12.0–15.0)
Lymphs Abs: 1.3 10*3/uL (ref 0.7–4.0)
MCH: 31.4 pg (ref 26.0–34.0)
Monocytes Absolute: 1.4 10*3/uL — ABNORMAL HIGH (ref 0.1–1.0)
Monocytes Relative: 9 % (ref 3–12)
Neutro Abs: 13 10*3/uL — ABNORMAL HIGH (ref 1.7–7.7)
Neutrophils Relative %: 83 % — ABNORMAL HIGH (ref 43–77)
RBC: 4.05 MIL/uL (ref 3.87–5.11)

## 2013-05-11 LAB — BASIC METABOLIC PANEL
Calcium: 8.5 mg/dL (ref 8.4–10.5)
Chloride: 98 mEq/L (ref 96–112)
GFR calc Af Amer: 76 mL/min — ABNORMAL LOW (ref >90)
GFR calc non Af Amer: 66 mL/min — ABNORMAL LOW (ref >90)
Glucose, Bld: 122 mg/dL — ABNORMAL HIGH (ref 70–99)
Potassium: 2.9 mEq/L — ABNORMAL LOW (ref 3.5–5.1)
Sodium: 134 mEq/L — ABNORMAL LOW (ref 135–145)

## 2013-05-11 LAB — URINALYSIS, ROUTINE W REFLEX MICROSCOPIC
Bilirubin Urine: NEGATIVE
Nitrite: POSITIVE — AB
Specific Gravity, Urine: 1.015 (ref 1.005–1.030)
Urobilinogen, UA: 1 mg/dL (ref 0.0–1.0)
pH: 7 (ref 5.0–8.0)

## 2013-05-11 LAB — URINE MICROSCOPIC-ADD ON

## 2013-05-11 LAB — POCT PREGNANCY, URINE: Preg Test, Ur: NEGATIVE

## 2013-05-11 MED ORDER — SODIUM CHLORIDE 0.9 % IV SOLN
1000.0000 mL | Freq: Once | INTRAVENOUS | Status: AC
  Administered 2013-05-11: 1000 mL via INTRAVENOUS

## 2013-05-11 MED ORDER — ACETAMINOPHEN 650 MG RE SUPP
650.0000 mg | Freq: Once | RECTAL | Status: AC
  Administered 2013-05-11: 650 mg via RECTAL
  Filled 2013-05-11: qty 1

## 2013-05-11 MED ORDER — SODIUM CHLORIDE 0.9 % IV SOLN
1000.0000 mL | INTRAVENOUS | Status: DC
  Administered 2013-05-11: 1000 mL via INTRAVENOUS

## 2013-05-11 MED ORDER — CIPROFLOXACIN 500 MG/5ML (10%) PO SUSR: 500.0000 mg | Freq: Two times a day (BID) | ORAL | Status: DC

## 2013-05-11 MED ORDER — POTASSIUM CHLORIDE CRYS ER 20 MEQ PO TBCR
40.0000 meq | EXTENDED_RELEASE_TABLET | Freq: Once | ORAL | Status: DC
  Filled 2013-05-11: qty 2

## 2013-05-11 MED ORDER — ONDANSETRON HCL 4 MG/2ML IJ SOLN
4.0000 mg | Freq: Once | INTRAMUSCULAR | Status: DC
  Filled 2013-05-11: qty 2

## 2013-05-11 MED ORDER — LEVOFLOXACIN IN D5W 500 MG/100ML IV SOLN
500.0000 mg | Freq: Once | INTRAVENOUS | Status: AC
  Administered 2013-05-11: 500 mg via INTRAVENOUS
  Filled 2013-05-11: qty 100

## 2013-05-11 MED ORDER — ONDANSETRON HCL 4 MG/2ML IJ SOLN
4.0000 mg | Freq: Once | INTRAMUSCULAR | Status: AC
  Administered 2013-05-11: 4 mg via INTRAVENOUS

## 2013-05-11 MED ORDER — ONDANSETRON 4 MG PO TBDP: 4.0000 mg | ORAL_TABLET | Freq: Three times a day (TID) | ORAL | Status: DC | PRN

## 2013-05-11 MED ORDER — POTASSIUM CHLORIDE 20 MEQ/15ML (10%) PO LIQD
40.0000 meq | Freq: Once | ORAL | Status: AC
  Administered 2013-05-11: 40 meq via ORAL
  Filled 2013-05-11: qty 30

## 2013-05-11 NOTE — ED Provider Notes (Signed)
Medical screening examination/treatment/procedure(s) were performed by non-physician practitioner and as supervising physician I was immediately available for consultation/collaboration.  EKG Interpretation   None         Benny Lennert, MD 05/11/13 [phone]

## 2013-05-11 NOTE — ED Provider Notes (Signed)
CSN: [id number]     Arrival date & time 05/10/13  2338 History   First MD Initiated Contact with Patient 05/10/13 2349     Chief Complaint  Patient presents with  . Abdominal Pain  . Emesis  . Fever   (Consider location/radiation/quality/duration/timing/severity/associated sxs/prior Treatment) HPI Comments: Patient is a 22 year old female who presents to the emergency department with complaint of abdominal pain, fever, and vomiting. The patient states that this started 2 days ago when she started having fever, noticing blood in her urine. The second day she started having chills and" the shakes". The patient contacted her primary care physician and was ordered on antibiotic. The patient states she is not sure of which antibiotic she was ordered. She got one dose in that stayed down she took a second dose in this did not stay down. The patient then decided to come to the emergency department for additional evaluation and management. It is of note that the patient gave birth in November of this year. Patient states that she had Foley catheters in place during that time. The patient also states that she is sexually active again.  Patient is a 22 y.o. female presenting with abdominal pain, vomiting, and fever. The history is provided by the patient.  Abdominal Pain Associated symptoms: fever, nausea and vomiting   Associated symptoms: no chest pain, no cough, no dysuria, no hematuria and no shortness of breath   Emesis Associated symptoms: abdominal pain   Associated symptoms: no arthralgias   Fever Associated symptoms: nausea and vomiting   Associated symptoms: no chest pain, no confusion, no cough and no dysuria     Past Medical History  Diagnosis Date  . Trichomonas   . Gonorrhea   . Chlamydia    Past Surgical History  Procedure Laterality Date  . Therapeutic abortion     Family History  Problem Relation Age of Onset  . Anesthesia problems Neg Hx   . Heart disease Father   .  Cancer Maternal Grandfather     prostate  . Kidney disease Paternal Grandmother    History  Substance Use Topics  . Smoking status: Former Smoker -- 0.01 packs/day for 1 years    Types: Cigarettes    Quit date: 09/06/2012  . Smokeless tobacco: Never Used     Comment: quit with pos preg test  . Alcohol Use: No     Comment: weekends   OB History   Grav Para Term Preterm Abortions TAB SAB Ect Mult Living   2 1  1 1 1  0   1     Review of Systems  Constitutional: Positive for fever. Negative for activity change.       All ROS Neg except as noted in HPI  HENT: Negative for nosebleeds.   Eyes: Negative for photophobia and discharge.  Respiratory: Negative for cough, shortness of breath and wheezing.   Cardiovascular: Negative for chest pain and palpitations.  Gastrointestinal: Positive for nausea, vomiting and abdominal pain. Negative for blood in stool.  Genitourinary: Negative for dysuria, frequency and hematuria.  Musculoskeletal: Positive for back pain. Negative for arthralgias and neck pain.  Skin: Negative.   Neurological: Negative for dizziness, seizures and speech difficulty.  Psychiatric/Behavioral: Negative for hallucinations and confusion.    Allergies  Penicillins  Home Medications   Current Outpatient Rx  Name  Route  Sig  Dispense  Refill  . ibuprofen (ADVIL,MOTRIN) 100 MG/5ML suspension   Oral   Take 30 mLs (600 mg total) by  mouth every 6 (six) hours.   473 mL   1   . prenatal vitamin w/FE, FA (NATACHEW) 29-1 MG CHEW chewable tablet   Oral   Chew 1 tablet by mouth daily at 12 noon.   30 tablet   4    BP 95/63  Pulse 125  Temp(Src) 103.3 F (39.6 C) (Oral)  Resp 20  Ht 5\' 6"  (1.676 m)  Wt 185 lb (83.915 kg)  BMI 29.87 kg/m2  SpO2 98%  LMP 04/14/2013  Breastfeeding? No Physical Exam  Nursing note and vitals reviewed. Constitutional: She is oriented to person, place, and time. She appears well-developed and well-nourished. She appears lethargic.   Non-toxic appearance. She has a sickly appearance.  HENT:  Head: Normocephalic.  Right Ear: Tympanic membrane and external ear normal.  Left Ear: Tympanic membrane and external ear normal.  Eyes: EOM and lids are normal. Pupils are equal, round, and reactive to light.  Neck: Normal range of motion. Neck supple. Carotid bruit is not present.  Cardiovascular: Regular rhythm, normal heart sounds, intact distal pulses and normal pulses.  Tachycardia present.   Pulmonary/Chest: Breath sounds normal. No respiratory distress.  Abdominal: Soft. Bowel sounds are normal. There is no tenderness. There is CVA tenderness. There is no guarding.  Musculoskeletal: Normal range of motion.  Lymphadenopathy:       Head (right side): No submandibular adenopathy present.       Head (left side): No submandibular adenopathy present.    She has no cervical adenopathy.  Neurological: She is oriented to person, place, and time. She has normal strength. She appears lethargic. No cranial nerve deficit or sensory deficit.  Skin: Skin is warm and dry.  Psychiatric: She has a normal mood and affect. Her speech is normal.    ED Course  Procedures (including critical care time) Labs Review Labs Reviewed  URINALYSIS, ROUTINE W REFLEX MICROSCOPIC   Imaging Review No results found.  EKG Interpretation   None       MDM  No diagnosis found. *I have reviewed nursing notes, vital signs, and all appropriate lab and imaging results for this patient.** CBC reveals wbc of 15,600, with 83 neut.  Urine preg Neg. basic metabolic panel shows the sodium to be low at 134, potassium low at 2.9, creatinine slightly high at 1.18, glomerular filtration rate low at 76. The urine analysis shows a hazy specimen with moderate hemoglobin on the dip stick, positive nitrates, large leukocyte esterase, and too many to count white blood cells with many bacteria.  Patient given IV fluids. Patient also given IV Levaquin. Patient tolerated  these without problem. The temperature has improved to 100.4 after Tylenol suppository. Potassium given orally. Pt drinking fluids in ED without problem.  Patient feels she can manage this at home with Zofran and oral Cipro. Patient will return to the emergency department on Monday the summer 22nd for recheck. Patient is been given instructions to return sooner if any changes, high   temperatures, or change in condition. The patient verbalizes understanding of instructions.   Kathie Dike, PA-C 05/11/13 0206

## 2013-05-13 LAB — URINE CULTURE

## 2013-05-14 NOTE — ED Notes (Signed)
+   Urine Patient treated with Cipro-sensitive to same-chart appended per protocol MD. 

## 2013-09-01 ENCOUNTER — Encounter (HOSPITAL_COMMUNITY): Payer: Self-pay | Admitting: *Deleted

## 2013-09-01 ENCOUNTER — Inpatient Hospital Stay (HOSPITAL_COMMUNITY)
Admission: AD | Admit: 2013-09-01 | Discharge: 2013-09-02 | Disposition: A | Discharge status: Home / Self Care | Payer: Federal, State, Local not specified - PPO | Source: Ambulatory Visit | Attending: Obstetrics and Gynecology | Admitting: Obstetrics and Gynecology

## 2013-09-01 DIAGNOSIS — R109 Unspecified abdominal pain: Secondary | ICD-10-CM | POA: Insufficient documentation

## 2013-09-01 DIAGNOSIS — Z880 Allergy status to penicillin: Secondary | ICD-10-CM | POA: Insufficient documentation

## 2013-09-01 DIAGNOSIS — J069 Acute upper respiratory infection, unspecified: Secondary | ICD-10-CM | POA: Insufficient documentation

## 2013-09-01 DIAGNOSIS — Z87891 Personal history of nicotine dependence: Secondary | ICD-10-CM | POA: Insufficient documentation

## 2013-09-01 DIAGNOSIS — O2390 Unspecified genitourinary tract infection in pregnancy, unspecified trimester: Secondary | ICD-10-CM | POA: Insufficient documentation

## 2013-09-01 DIAGNOSIS — O9989 Other specified diseases and conditions complicating pregnancy, childbirth and the puerperium: Principal | ICD-10-CM

## 2013-09-01 DIAGNOSIS — N390 Urinary tract infection, site not specified: Secondary | ICD-10-CM | POA: Insufficient documentation

## 2013-09-01 DIAGNOSIS — B9689 Other specified bacterial agents as the cause of diseases classified elsewhere: Secondary | ICD-10-CM | POA: Insufficient documentation

## 2013-09-01 DIAGNOSIS — J3489 Other specified disorders of nose and nasal sinuses: Secondary | ICD-10-CM | POA: Insufficient documentation

## 2013-09-01 DIAGNOSIS — N760 Acute vaginitis: Secondary | ICD-10-CM | POA: Insufficient documentation

## 2013-09-01 DIAGNOSIS — A499 Bacterial infection, unspecified: Secondary | ICD-10-CM | POA: Insufficient documentation

## 2013-09-01 DIAGNOSIS — O99891 Other specified diseases and conditions complicating pregnancy: Secondary | ICD-10-CM | POA: Insufficient documentation

## 2013-09-01 LAB — URINALYSIS, ROUTINE W REFLEX MICROSCOPIC
BILIRUBIN URINE: NEGATIVE
Glucose, UA: NEGATIVE mg/dL
HGB URINE DIPSTICK: NEGATIVE
Ketones, ur: NEGATIVE mg/dL
Leukocytes, UA: NEGATIVE
Nitrite: POSITIVE — AB
PROTEIN: NEGATIVE mg/dL
Specific Gravity, Urine: 1.015 (ref 1.005–1.030)
UROBILINOGEN UA: 0.2 mg/dL (ref 0.0–1.0)
pH: 8 (ref 5.0–8.0)

## 2013-09-01 LAB — URINE MICROSCOPIC-ADD ON

## 2013-09-01 NOTE — MAU Note (Addendum)
I've had congestion, runny nose, sneezing, scratchy, itchy throat. I feel weak. Some mild cramping in lower stomach for couple days. Tonight had sharp pain in my chest but it went away. Took preg test Monday and was positive

## 2013-09-02 ENCOUNTER — Encounter (HOSPITAL_COMMUNITY): Payer: Self-pay | Admitting: *Deleted

## 2013-09-02 LAB — WET PREP, GENITAL
TRICH WET PREP: NONE SEEN
YEAST WET PREP: NONE SEEN

## 2013-09-02 MED ORDER — ACETAMINOPHEN-CODEINE #3 300-30 MG PO TABS: 1.0000 | ORAL_TABLET | Freq: Four times a day (QID) | ORAL | Status: DC | PRN

## 2013-09-02 MED ORDER — SULFAMETHOXAZOLE-TRIMETHOPRIM 800-160 MG PO TABS: 1.0000 | ORAL_TABLET | Freq: Two times a day (BID) | ORAL | Status: DC

## 2013-09-02 MED ORDER — METRONIDAZOLE 500 MG PO TABS: 500.0000 mg | ORAL_TABLET | Freq: Two times a day (BID) | ORAL | Status: DC

## 2013-09-02 MED ORDER — FLUCONAZOLE 150 MG PO TABS: 150.0000 mg | ORAL_TABLET | Freq: Every day | ORAL | Status: DC

## 2013-09-02 MED ORDER — ACETAMINOPHEN-CODEINE #3 300-30 MG PO TABS
1.0000 | ORAL_TABLET | Freq: Once | ORAL | Status: AC
  Administered 2013-09-02: 1 via ORAL
  Filled 2013-09-02: qty 1

## 2013-09-02 NOTE — MAU Note (Signed)
Lauren RodneyLinda Barefoot NP in to discuss test results and d/c plan

## 2013-09-02 NOTE — MAU Provider Note (Signed)
History     CSN: [id number]  Arrival date and time: 09/01/13 2241   First Provider Initiated Contact with Patient 09/01/13 2343      Chief Complaint  Patient presents with  . Nasal Congestion  . Abdominal Cramping   HPI Comments: Lauren Diaz 22 y.o. [phone] is [redacted] weeks pregnant by her LMP. She has had a + HPT. She has 2 issues tonight first is URI sx including nasal congestion, runny nose, sneezing, sore throat and fever. Her son also has same symptoms. She felt like it started out with allergies and progressed. She has taken a rare tylenol only. Her second complaint is that of a once daily cramp in her lower abdomen. She declines all testing for this at this time.     Abdominal Cramping Associated symptoms include a fever.      Past Medical History  Diagnosis Date  . Trichomonas   . Gonorrhea   . Chlamydia     Past Surgical History  Procedure Laterality Date  . Therapeutic abortion      Family History  Problem Relation Age of Onset  . Anesthesia problems Neg Hx   . Heart disease Father   . Cancer Maternal Grandfather     prostate  . Kidney disease Paternal Grandmother     History  Substance Use Topics  . Smoking status: Former Smoker -- 0.01 packs/day for 1 years    Types: Cigarettes    Quit date: 09/06/2012  . Smokeless tobacco: Never Used     Comment: quit with pos preg test  . Alcohol Use: No     Comment: weekends    Allergies:  Allergies  Allergen Reactions  . Penicillins Rash and Other (See Comments)    Childhood reaction    Prescriptions prior to admission  Medication Sig Dispense Refill  . DM-Phenylephrine-Acetaminophen (TYLENOL COLD MULTI-SYMPTOM) 10-5-325 MG/15ML LIQD Take 2 mLs by mouth.      . ciprofloxacin (CIPRO) 500 MG/5ML (10%) suspension Take 5 mLs (500 mg total) by mouth 2 (two) times daily.  70 mL  0  . ibuprofen (ADVIL,MOTRIN) 100 MG/5ML suspension Take 30 mLs (600 mg total) by mouth every 6 (six) hours.  473 mL  1  .  ondansetron (ZOFRAN ODT) 4 MG disintegrating tablet Take 1 tablet (4 mg total) by mouth every 8 (eight) hours as needed for nausea or vomiting.  12 tablet  0  . prenatal vitamin w/FE, FA (NATACHEW) 29-1 MG CHEW chewable tablet Chew 1 tablet by mouth daily at 12 noon.  30 tablet  4    Review of Systems  Constitutional: Positive for fever and malaise/fatigue.  HENT: Positive for congestion and sore throat.   Gastrointestinal: Positive for abdominal pain.       Pelvic cramping once daily  Genitourinary: Negative.   Musculoskeletal: Negative.   Skin: Negative.   Neurological: Negative.   Psychiatric/Behavioral: Negative.    Physical Exam   Blood pressure 136/66, pulse 105, temperature 100.4 F (38 C), resp. rate 22, height 5\' 6"  (1.676 m), weight 185 lb 3.2 oz (84.006 kg), last menstrual period 07/27/2013, SpO2 99.00%, not currently breastfeeding.  Physical Exam  Constitutional: She is oriented to person, place, and time. She appears well-developed and well-nourished. No distress.  HENT:  Head: Normocephalic and atraumatic.  Right Ear: Tympanic membrane is erythematous.  Left Ear: Tympanic membrane is erythematous.  Nose: Rhinorrhea present.  Mouth/Throat: Posterior oropharyngeal erythema present. No oropharyngeal exudate or tonsillar abscesses.  Eyes: Pupils are equal,  round, and reactive to light.  Cardiovascular: Normal rate, regular rhythm and normal heart sounds.   Respiratory: Effort normal and breath sounds normal. No respiratory distress. She has no wheezes. She has no rales. She exhibits no tenderness.  GI: Soft. Bowel sounds are normal.  Genitourinary:  Genital:External negative Vaginal:thin white discharge Cervix:closed/ thick Bimanual:nontender   Musculoskeletal: Normal range of motion.  Neurological: She is alert and oriented to person, place, and time.  Skin: Skin is warm and dry.  Psychiatric: She has a normal mood and affect. Her behavior is normal. Judgment and  thought content normal.   Results for orders placed during the hospital encounter of 09/01/13 (from the past 24 hour(s))  URINALYSIS, ROUTINE W REFLEX MICROSCOPIC     Status: Abnormal   Collection Time    09/01/13 11:00 PM      Result Value Ref Range   Color, Urine YELLOW  YELLOW   APPearance CLEAR  CLEAR   Specific Gravity, Urine 1.015  1.005 - 1.030   pH 8.0  5.0 - 8.0   Glucose, UA NEGATIVE  NEGATIVE mg/dL   Hgb urine dipstick NEGATIVE  NEGATIVE   Bilirubin Urine NEGATIVE  NEGATIVE   Ketones, ur NEGATIVE  NEGATIVE mg/dL   Protein, ur NEGATIVE  NEGATIVE mg/dL   Urobilinogen, UA 0.2  0.0 - 1.0 mg/dL   Nitrite POSITIVE (*) NEGATIVE   Leukocytes, UA NEGATIVE  NEGATIVE  URINE MICROSCOPIC-ADD ON     Status: Abnormal   Collection Time    09/01/13 11:00 PM      Result Value Ref Range   Squamous Epithelial / LPF RARE  RARE   WBC, UA 0-2  <3 WBC/hpf   RBC / HPF 3-6  <3 RBC/hpf   Bacteria, UA MANY (*) RARE  WET PREP, GENITAL     Status: Abnormal   Collection Time    09/01/13 11:45 PM      Result Value Ref Range   Yeast Wet Prep HPF POC NONE SEEN  NONE SEEN   Trich, Wet Prep NONE SEEN  NONE SEEN   Clue Cells Wet Prep HPF POC MODERATE (*) NONE SEEN   WBC, Wet Prep HPF POC FEW (*) NONE SEEN     MAU Course  Procedures  MDM  Pt declined all testing for r/o ectopic  Wet Prep/ GC/Chlamydia Tylenol #3 for cough and discomforts Pt allergic to PCN Assessment and Plan   A:URI UTI Bacterial vaginosis  P: Septra DS po bID x 5 days to cover both Flagyl 500 mg PO BID X7 days Diflucan 150 mg one time if needed Tylenol # 3 q4 hours for sore throat and cough Follow up with Dr Arelia Sneddon for start prenatal care     Doralee Albino Candra Wegner 09/02/2013, 12:38 AM

## 2013-09-02 NOTE — Progress Notes (Signed)
Jannifer RodneyLinda Barefoot NP in earlier to discuss test results and d/c instructions. Written and verbal d/c instructions given and understanding voiced

## 2013-09-02 NOTE — Discharge Instructions (Signed)
Bacterial Vaginosis °Bacterial vaginosis is a vaginal infection that occurs when the normal balance of bacteria in the vagina is disrupted. It results from an overgrowth of certain bacteria. This is the most common vaginal infection in women of childbearing age. Treatment is important to prevent complications, especially in pregnant women, as it can cause a premature delivery. °CAUSES  °Bacterial vaginosis is caused by an increase in harmful bacteria that are normally present in smaller amounts in the vagina. Several different kinds of bacteria can cause bacterial vaginosis. However, the reason that the condition develops is not fully understood. °RISK FACTORS °Certain activities or behaviors can put you at an increased risk of developing bacterial vaginosis, including: °· Having a new sex partner or multiple sex partners. °· Douching. °· Using an intrauterine device (IUD) for contraception. °Women do not get bacterial vaginosis from toilet seats, bedding, swimming pools, or contact with objects around them. °SIGNS AND SYMPTOMS  °Some women with bacterial vaginosis have no signs or symptoms. Common symptoms include: °· Grey vaginal discharge. °· A fishlike odor with discharge, especially after sexual intercourse. °· Itching or burning of the vagina and vulva. °· Burning or pain with urination. °DIAGNOSIS  °Your health care provider will take a medical history and examine the vagina for signs of bacterial vaginosis. A sample of vaginal fluid may be taken. Your health care provider will look at this sample under a microscope to check for bacteria and abnormal cells. A vaginal pH test may also be done.  °TREATMENT  °Bacterial vaginosis may be treated with antibiotic medicines. These may be given in the form of a pill or a vaginal cream. A second round of antibiotics may be prescribed if the condition comes back after treatment.  °HOME CARE INSTRUCTIONS  °· Only take over-the-counter or prescription medicines as  directed by your health care provider. °· If antibiotic medicine was prescribed, take it as directed. Make sure you finish it even if you start to feel better. °· Do not have sex until treatment is completed. °· Tell all sexual partners that you have a vaginal infection. They should see their health care provider and be treated if they have problems, such as a mild rash or itching. °· Practice safe sex by using condoms and only having one sex partner. °SEEK MEDICAL CARE IF:  °· Your symptoms are not improving after 3 days of treatment. °· You have increased discharge or pain. °· You have a fever. °MAKE SURE YOU:  °· Understand these instructions. °· Will watch your condition. °· Will get help right away if you are not doing well or get worse. °FOR MORE INFORMATION  °Centers for Disease Control and Prevention, Division of STD Prevention: www.cdc.gov/std °American Sexual Health Association (ASHA): www.ashastd.org  °Document Released: 05/10/2005 Document Revised: 02/28/2013 Document Reviewed: 12/20/2012 °Exit[employer]® Patient Information ©2014 [employer]. °Urinary Tract Infection °Urinary tract infections (UTIs) can develop anywhere along your urinary tract. Your urinary tract is your body's drainage system for removing wastes and extra water. Your urinary tract includes two kidneys, two ureters, a bladder, and a urethra. Your kidneys are a pair of bean-shaped organs. Each kidney is about the size of your fist. They are located below your ribs, one on each side of your spine. °CAUSES °Infections are caused by microbes, which are microscopic organisms, including fungi, viruses, and bacteria. These organisms are so small that they can only be seen through a microscope. Bacteria are the microbes that most commonly cause UTIs. °SYMPTOMS  °Symptoms of UTIs   may vary by age and gender of the patient and by the location of the infection. Symptoms in young women typically include a frequent and intense urge to urinate and a  painful, burning feeling in the bladder or urethra during urination. Older women and men are more likely to be tired, shaky, and weak and have muscle aches and abdominal pain. A fever may mean the infection is in your kidneys. Other symptoms of a kidney infection include pain in your back or sides below the ribs, nausea, and vomiting. DIAGNOSIS To diagnose a UTI, your caregiver will ask you about your symptoms. Your caregiver also will ask to provide a urine sample. The urine sample will be tested for bacteria and white blood cells. White blood cells are made by your body to help fight infection. TREATMENT  Typically, UTIs can be treated with medication. Because most UTIs are caused by a bacterial infection, they usually can be treated with the use of antibiotics. The choice of antibiotic and length of treatment depend on your symptoms and the type of bacteria causing your infection. HOME CARE INSTRUCTIONS  If you were prescribed antibiotics, take them exactly as your caregiver instructs you. Finish the medication even if you feel better after you have only taken some of the medication.  Drink enough water and fluids to keep your urine clear or pale yellow.  Avoid caffeine, tea, and carbonated beverages. They tend to irritate your bladder.  Empty your bladder often. Avoid holding urine for long periods of time.  Empty your bladder before and after sexual intercourse.  After a bowel movement, women should cleanse from front to back. Use each tissue only once. SEEK MEDICAL CARE IF:   You have back pain.  You develop a fever.  Your symptoms do not begin to resolve within 3 days. SEEK IMMEDIATE MEDICAL CARE IF:   You have severe back pain or lower abdominal pain.  You develop chills.  You have nausea or vomiting.  You have continued burning or discomfort with urination. MAKE SURE YOU:   Understand these instructions.  Will watch your condition.  Will get help right away if you are  not doing well or get worse. Document Released: 02/17/2005 Document Revised: 11/09/2011 Document Reviewed: 06/18/2011 [facility] Patient Information 2014 [place], [place]. Upper Respiratory Infection, Adult An upper respiratory infection (URI) is also known as the common cold. It is often caused by a type of germ (virus). Colds are easily spread (contagious). You can pass it to others by kissing, coughing, sneezing, or drinking out of the same glass. Usually, you get better in 1 or 2 weeks.  HOME CARE   Only take medicine as told by your doctor.  Use a warm mist humidifier or breathe in steam from a hot shower.  Drink enough water and fluids to keep your pee (urine) clear or pale yellow.  Get plenty of rest.  Return to work when your temperature is back to normal or as told by your doctor. You may use a face mask and wash your hands to stop your cold from spreading. GET HELP RIGHT AWAY IF:   After the first few days, you feel you are getting worse.  You have questions about your medicine.  You have chills, shortness of breath, or brown or red spit (mucus).  You have yellow or brown snot (nasal discharge) or pain in the face, especially when you bend forward.  You have a fever, puffy (swollen) neck, pain when you swallow, or white spots  in the back of your throat.  You have a bad headache, ear pain, sinus pain, or chest pain.  You have a high-pitched whistling sound when you breathe in and out (wheezing).  You have a lasting cough or cough up blood.  You have sore muscles or a stiff neck. MAKE SURE YOU:   Understand these instructions.  Will watch your condition.  Will get help right away if you are not doing well or get worse. Document Released: 10/27/2007 Document Revised: 08/02/2011 Document Reviewed: 09/14/2010 [facility] Patient Information 2014 [place], [place].

## 2013-09-03 LAB — GC/CHLAMYDIA PROBE AMP
CT PROBE, AMP APTIMA: NEGATIVE
GC Probe RNA: NEGATIVE

## 2013-09-03 LAB — POCT PREGNANCY, URINE: Preg Test, Ur: POSITIVE — AB

## 2013-09-09 ENCOUNTER — Inpatient Hospital Stay (HOSPITAL_COMMUNITY)
Admission: AD | Admit: 2013-09-09 | Discharge: 2013-09-09 | Disposition: A | Discharge status: Home / Self Care | Payer: Federal, State, Local not specified - PPO | Source: Ambulatory Visit | Attending: Obstetrics & Gynecology | Admitting: Obstetrics & Gynecology

## 2013-09-09 ENCOUNTER — Encounter (HOSPITAL_COMMUNITY): Payer: Self-pay

## 2013-09-09 DIAGNOSIS — O99891 Other specified diseases and conditions complicating pregnancy: Secondary | ICD-10-CM | POA: Insufficient documentation

## 2013-09-09 DIAGNOSIS — O9989 Other specified diseases and conditions complicating pregnancy, childbirth and the puerperium: Principal | ICD-10-CM

## 2013-09-09 DIAGNOSIS — Z87891 Personal history of nicotine dependence: Secondary | ICD-10-CM | POA: Insufficient documentation

## 2013-09-09 DIAGNOSIS — L282 Other prurigo: Secondary | ICD-10-CM

## 2013-09-09 DIAGNOSIS — B86 Scabies: Secondary | ICD-10-CM

## 2013-09-09 DIAGNOSIS — R21 Rash and other nonspecific skin eruption: Secondary | ICD-10-CM | POA: Insufficient documentation

## 2013-09-09 MED ORDER — PERMETHRIN 5 % EX CREA: TOPICAL_CREAM | CUTANEOUS | Status: DC

## 2013-09-09 NOTE — MAU Provider Note (Signed)
History     CSN: [id number]  Arrival date and time: 09/09/13 1004   First Provider Initiated Contact with Patient 09/09/13 1118      Chief Complaint  Patient presents with  . Rash   HPI  [name] is a 23 y.o. female [id number] at 6160w2d who presents with questionable bed bugs, flea bits and/or scabies. The patient is staying with her parents in a home where there is active bugs in the carpet and in the beds. There are other members of the household who have similar symptoms.  The patient said the symptoms started on her hands and have been present for about 1 month. The areas are very itchy and uncomfortable; the patient has not tried anything over the counter for the symptoms.   OB History   Grav Para Term Preterm Abortions TAB SAB Ect Mult Living   3 1  1 1 1  0   1      Past Medical History  Diagnosis Date  . Trichomonas   . Gonorrhea   . Chlamydia     Past Surgical History  Procedure Laterality Date  . Therapeutic abortion      Family History  Problem Relation Age of Onset  . Anesthesia problems Neg Hx   . Heart disease Father   . Cancer Maternal Grandfather     prostate  . Kidney disease Paternal Grandmother     History  Substance Use Topics  . Smoking status: Former Smoker -- 0.01 packs/day for 1 years    Types: Cigarettes    Quit date: 09/06/2012  . Smokeless tobacco: Never Used     Comment: quit with pos preg test  . Alcohol Use: No     Comment: weekends    Allergies:  Allergies  Allergen Reactions  . Penicillins Rash and Other (See Comments)    Childhood reaction    Prescriptions prior to admission  Medication Sig Dispense Refill  . fluconazole (DIFLUCAN) 150 MG tablet Take 1 tablet (150 mg total) by mouth daily.  1 tablet  1   No results found for this or any previous visit (from the past 48 hour(s)).  Review of Systems  Constitutional: Negative for fever and chills.  Gastrointestinal: Negative for heartburn, nausea, vomiting,  abdominal pain and diarrhea.  Skin: Positive for itching and rash.   Physical Exam   Blood pressure 128/57, pulse 92, temperature 98.4 F (36.9 C), temperature source Oral, resp. rate 16, height 5\' 6"  (1.676 m), weight 84.142 kg (185 lb 8 oz), last menstrual period 07/27/2013, not currently breastfeeding.  Physical Exam  Constitutional: She is oriented to person, place, and time. She appears well-developed and well-nourished. No distress.  HENT:  Head: Normocephalic.  Eyes: Pupils are equal, round, and reactive to light.  Neck: Neck supple.  Respiratory: Effort normal.  Musculoskeletal: Normal range of motion.  Neurological: She is alert and oriented to person, place, and time.  Skin: Skin is warm and intact. Lesion and rash noted. She is not diaphoretic. There is erythema.     Linear rash on right anterior knee.   Psychiatric: Her behavior is normal.    MAU Course  Procedures None  MDM   Assessment and Plan   A:  1. Scabies infestation   2. Pruritic rash    P:  Discharge home in stable condition RX: Permethrin- see special instructions Clean all bedding and clothing in hot water All member's of the house hold may need to be treated Return  if symptoms worsen.   Iona HansenJennifer Irene Yvetta Drotar, NP  09/09/2013, 11:19 AM

## 2013-09-09 NOTE — MAU Provider Note (Signed)
Attestation of Attending Supervision of Advanced Practitioner (CNM/NP): Evaluation and management procedures were performed by the Advanced Practitioner under my supervision and collaboration.  I have reviewed the Advanced Practitioner's note and chart, and I agree with the management and plan.  Gyasi Hazzard Harraway-Smith 5:37 PM     

## 2013-09-09 NOTE — MAU Note (Signed)
Pt states for past three weeks has noticed more and more red spots on her arms and now starting on her legs and breasts. Unsure if they are bed bugs or flea bites. Denies bleeding or vaginal d/c issues. Areas do itch

## 2013-09-09 NOTE — Discharge Instructions (Signed)
Scabies Scabies are small bugs (mites) that burrow under the skin and cause red bumps and severe itching. These bugs can only be seen with a microscope. Scabies are highly contagious. They can spread easily from person to person by direct contact. They are also spread through sharing clothing or linens that have the scabies mites living in them. It is not unusual for an entire family to become infected through shared towels, clothing, or bedding.  HOME CARE INSTRUCTIONS   Your caregiver may prescribe a cream or lotion to kill the mites. If cream is prescribed, massage the cream into the entire body from the neck to the bottom of both feet. Also massage the cream into the scalp and face if your child is less than 21 year old. Avoid the eyes and mouth. Do not wash your hands after application.  Leave the cream on for 8 to 12 hours. Your child should bathe or shower after the 8 to 12 hour application period. Sometimes it is helpful to apply the cream to your child right before bedtime.  One treatment is usually effective and will eliminate approximately 95% of infestations. For severe cases, your caregiver may decide to repeat the treatment in 1 week. Everyone in your household should be treated with one application of the cream.  New rashes or burrows should not appear within 24 to 48 hours after successful treatment. However, the itching and rash may last for 2 to 4 weeks after successful treatment. Your caregiver may prescribe a medicine to help with the itching or to help the rash go away more quickly.  Scabies can live on clothing or linens for up to 3 days. All of your child's recently used clothing, towels, stuffed toys, and bed linens should be washed in hot water and then dried in a dryer for at least 20 minutes on high heat. Items that cannot be washed should be enclosed in a plastic bag for at least 3 days.  To help relieve itching, bathe your child in a cool bath or apply cool washcloths to the  affected areas.  Your child may return to school after treatment with the prescribed cream. SEEK MEDICAL CARE IF:   The itching persists longer than 4 weeks after treatment.  The rash spreads or becomes infected. Signs of infection include red blisters or yellow-tan crust. Document Released: 05/10/2005 Document Revised: 08/02/2011 Document Reviewed: 09/18/2008 [facility] Patient Information 2014 [place], [place].  Rash A rash is a change in the color or feel of your skin. There are many different types of rashes. You may have other problems along with your rash. HOME CARE  Avoid the thing that caused your rash.  Do not scratch your rash.  You may take cools baths to help stop itching.  Only take medicines as told by your doctor.  Keep all doctor visits as told. GET HELP RIGHT AWAY IF:   Your pain, puffiness (swelling), or redness gets worse.  You have a fever.  You have new or severe problems.  You have body aches, watery poop (diarrhea), or you throw up (vomit).  Your rash is not better after 3 days. MAKE SURE YOU:   Understand these instructions.  Will watch your condition.  Will get help right away if you are not doing well or get worse. Document Released: 10/27/2007 Document Revised: 08/02/2011 Document Reviewed: 02/22/2011 [facility] Patient Information 2014 [place], [place].

## 2013-10-09 LAB — OB RESULTS CONSOLE RPR: RPR: NONREACTIVE

## 2013-10-09 LAB — OB RESULTS CONSOLE ANTIBODY SCREEN: Antibody Screen: NEGATIVE

## 2013-10-09 LAB — OB RESULTS CONSOLE RUBELLA ANTIBODY, IGM: RUBELLA: IMMUNE

## 2013-10-09 LAB — OB RESULTS CONSOLE ABO/RH: RH Type: POSITIVE

## 2013-10-09 LAB — OB RESULTS CONSOLE GC/CHLAMYDIA
CHLAMYDIA, DNA PROBE: NEGATIVE
GC PROBE AMP, GENITAL: NEGATIVE

## 2013-10-09 LAB — OB RESULTS CONSOLE HIV ANTIBODY (ROUTINE TESTING): HIV: NONREACTIVE

## 2013-10-09 LAB — OB RESULTS CONSOLE HEPATITIS B SURFACE ANTIGEN: HEP B S AG: NEGATIVE

## 2014-02-15 ENCOUNTER — Encounter (HOSPITAL_COMMUNITY): Payer: Self-pay | Admitting: *Deleted

## 2014-02-15 ENCOUNTER — Observation Stay (HOSPITAL_COMMUNITY)
Admission: AD | Admit: 2014-02-15 | Discharge: 2014-02-18 | DRG: 782 | Disposition: A | Discharge status: Home / Self Care | Payer: Federal, State, Local not specified - PPO | Source: Ambulatory Visit | Attending: Obstetrics and Gynecology | Admitting: Obstetrics and Gynecology

## 2014-02-15 DIAGNOSIS — O3430 Maternal care for cervical incompetence, unspecified trimester: Secondary | ICD-10-CM | POA: Diagnosis present

## 2014-02-15 DIAGNOSIS — Z87891 Personal history of nicotine dependence: Secondary | ICD-10-CM | POA: Diagnosis not present

## 2014-02-15 DIAGNOSIS — O26872 Cervical shortening, second trimester: Secondary | ICD-10-CM

## 2014-02-15 DIAGNOSIS — Z1389 Encounter for screening for other disorder: Secondary | ICD-10-CM

## 2014-02-15 DIAGNOSIS — O26879 Cervical shortening, unspecified trimester: Principal | ICD-10-CM | POA: Diagnosis present

## 2014-02-15 MED ORDER — PRENATAL MULTIVITAMIN CH: 1.0000 | ORAL_TABLET | Freq: Every day | ORAL | Status: DC

## 2014-02-15 MED ORDER — PROGESTERONE MICRONIZED 200 MG PO CAPS
200.0000 mg | ORAL_CAPSULE | Freq: Every day | ORAL | Status: DC
  Administered 2014-02-15: 200 mg via VAGINAL
  Filled 2014-02-15: qty 1

## 2014-02-15 MED ORDER — ACETAMINOPHEN 325 MG PO TABS: 650.0000 mg | ORAL_TABLET | ORAL | Status: DC | PRN

## 2014-02-15 MED ORDER — ZOLPIDEM TARTRATE 5 MG PO TABS: 5.0000 mg | ORAL_TABLET | Freq: Every evening | ORAL | Status: DC | PRN

## 2014-02-15 MED ORDER — CALCIUM CARBONATE ANTACID 500 MG PO CHEW: 2.0000 | CHEWABLE_TABLET | ORAL | Status: DC | PRN

## 2014-02-15 MED ORDER — DOCUSATE SODIUM 100 MG PO CAPS: 100.0000 mg | ORAL_CAPSULE | Freq: Every day | ORAL | Status: DC

## 2014-02-15 MED ORDER — BETAMETHASONE SOD PHOS & ACET 6 (3-3) MG/ML IJ SUSP
12.0000 mg | INTRAMUSCULAR | Status: DC
  Administered 2014-02-15: 12 mg via INTRAMUSCULAR
  Filled 2014-02-15: qty 2

## 2014-02-15 NOTE — H&P (Signed)
Lauren Diaz is a 23 y.o. female presenting at 36 weeks with lost of cervical length and past histoey of preterm delivery. Maternal Medical History:  Prenatal complications: no prenatal complications Prenatal Complications - Diabetes: none.    OB History   Grav Para Term Preterm Abortions TAB SAB Ect Mult Living   0   1     Past Medical History  Diagnosis Date  . Trichomonas   . Gonorrhea   . Chlamydia    Past Surgical History  Procedure Laterality Date  . Therapeutic abortion     Family History: family history includes Cancer in her maternal grandfather; Heart disease in her father; Kidney disease in her paternal grandmother. There is no history of Anesthesia problems. Social History:  reports that she quit smoking about 17 months ago. Her smoking use included Cigarettes. She has a .01 pack-year smoking history. She has never used smokeless tobacco. She reports that she does not drink alcohol or use illicit drugs.   Prenatal Transfer Tool  Maternal Diabetes: no Genetic Screening: normal Maternal Ultrasounds/Referrals: normal Fetal Ultrasounds or other Referrals:  none Maternal Substance Abuse:  none Significant Maternal Medications:  none Significant Maternal Lab Results:  none Other Comments:  none  ROS    Last menstrual period 07/27/2013, not currently breastfeeding. Maternal Exam:  Abdomen: Fundal height is c/ w dates.    Cervix: Long and closed  Fetal Exam Fetal State Assessment: Category I - tracings are normal.     Physical Exam  Prenatal labs: ABO, Rh: --/--/O POS (11/02 1610) Antibody: NEG (11/02 0648) Rubella:   RPR: NON REACTIVE (11/20 1845)  HBsAg:    HIV:    GBS: Negative (10/22 0000)   Assessment/Plan: IUP AT 29 weeks with cerical shortening admitt for monitoring and steriods  Merwyn Hodapp S 02/15/2014, 3:39 PM

## 2014-02-16 ENCOUNTER — Observation Stay (HOSPITAL_COMMUNITY): Payer: Federal, State, Local not specified - PPO

## 2014-02-16 DIAGNOSIS — O26879 Cervical shortening, unspecified trimester: Secondary | ICD-10-CM | POA: Diagnosis present

## 2014-02-16 MED ORDER — CALCIUM CARBONATE ANTACID 500 MG PO CHEW: 2.0000 | CHEWABLE_TABLET | ORAL | Status: DC | PRN

## 2014-02-16 MED ORDER — ACETAMINOPHEN 325 MG PO TABS: 650.0000 mg | ORAL_TABLET | ORAL | Status: DC | PRN

## 2014-02-16 MED ORDER — PRENATAL MULTIVITAMIN CH
1.0000 | ORAL_TABLET | Freq: Every day | ORAL | Status: DC
  Administered 2014-02-16 – 2014-02-18 (×3): 1 via ORAL
  Filled 2014-02-16 (×3): qty 1

## 2014-02-16 MED ORDER — BETAMETHASONE SOD PHOS & ACET 6 (3-3) MG/ML IJ SUSP
12.0000 mg | INTRAMUSCULAR | Status: AC
  Administered 2014-02-16: 12 mg via INTRAMUSCULAR
  Filled 2014-02-16 (×2): qty 2

## 2014-02-16 MED ORDER — PROGESTERONE MICRONIZED 200 MG PO CAPS
200.0000 mg | ORAL_CAPSULE | Freq: Every day | ORAL | Status: DC
  Administered 2014-02-16 – 2014-02-17 (×2): 200 mg via VAGINAL
  Filled 2014-02-16 (×2): qty 1

## 2014-02-16 MED ORDER — ZOLPIDEM TARTRATE 5 MG PO TABS: 5.0000 mg | ORAL_TABLET | Freq: Every evening | ORAL | Status: DC | PRN

## 2014-02-16 MED ORDER — DOCUSATE SODIUM 100 MG PO CAPS
100.0000 mg | ORAL_CAPSULE | Freq: Every day | ORAL | Status: DC
  Administered 2014-02-16 – 2014-02-18 (×3): 100 mg via ORAL
  Filled 2014-02-16 (×3): qty 1

## 2014-02-16 NOTE — Progress Notes (Signed)
UR completed 

## 2014-02-16 NOTE — Progress Notes (Signed)
Patient ID: Lauren Diaz, female   DOB: March 02, 1991, 23 y.o.   MRN: [id number] S: NO CTX'S OR LEAKAGE O: AF VSS     GRAVID UTERUS GOOD FETAL MOVEMENT     FHR CAT ONE NO CTX A: IUP AT 29.1 WITH SHORTENED CERVIX P: CHECK SONO FINISH BMZ

## 2014-02-17 DIAGNOSIS — O26879 Cervical shortening, unspecified trimester: Secondary | ICD-10-CM | POA: Diagnosis not present

## 2014-02-17 MED ORDER — NIFEDIPINE 10 MG PO CAPS
10.0000 mg | ORAL_CAPSULE | Freq: Three times a day (TID) | ORAL | Status: DC
  Administered 2014-02-17 – 2014-02-18 (×5): 10 mg via ORAL
  Filled 2014-02-17 (×5): qty 1

## 2014-02-17 NOTE — Progress Notes (Signed)
Patient ID: Lauren Diaz, female   DOB: 1991/03/05, 23 y.o.   MRN: [id number] S: NO LEAKAGE OR BLEEDING O: AF VSS      GRAVID UTERUS NONTENDER      MONITOR NO CTX'S NOTED      SONOGRAM YESTERDAY CERVICAL LENGTH 1.3 WITH FUNNELING  WAS 1.8 A: IUP AT 29.2 WITH SHORTENING CERVIX     HISTORY OF PRIOR PRETERM DEL AFTER PREMATURE RUPTURE OF MEMBRANES P:  BEGIN PROCARDIA IN CASE WE ARE MISSING SOME UTERINE ACTIVITY       FINISHED BMZ       SONO IN AM FOR CERVICAL LENGTH

## 2014-02-18 ENCOUNTER — Inpatient Hospital Stay (HOSPITAL_COMMUNITY): Payer: Federal, State, Local not specified - PPO

## 2014-02-18 DIAGNOSIS — O26879 Cervical shortening, unspecified trimester: Secondary | ICD-10-CM | POA: Diagnosis not present

## 2014-02-18 LAB — CBC
HCT: 31.2 % — ABNORMAL LOW (ref 36.0–46.0)
Hemoglobin: 10.3 g/dL — ABNORMAL LOW (ref 12.0–15.0)
MCH: 31.2 pg (ref 26.0–34.0)
MCHC: 33 g/dL (ref 30.0–36.0)
MCV: 94.5 fL (ref 78.0–100.0)
PLATELETS: 226 10*3/uL (ref 150–400)
RBC: 3.3 MIL/uL — AB (ref 3.87–5.11)
RDW: 13.5 % (ref 11.5–15.5)
WBC: 16.7 10*3/uL — ABNORMAL HIGH (ref 4.0–10.5)

## 2014-02-18 MED ORDER — NIFEDIPINE 10 MG PO CAPS: 10.0000 mg | ORAL_CAPSULE | Freq: Three times a day (TID) | ORAL | Status: DC

## 2014-02-18 NOTE — Progress Notes (Signed)
Discharge instructions given to patient.  She verbalizes understanding of symptoms to return to the hospital.

## 2014-02-18 NOTE — Discharge Summary (Signed)
Obstetric Discharge Summary Reason for Admission: observation/evaluation Prenatal Procedures: ultrasound Intrapartum Procedures: n/a Postpartum Procedures: n/a Complications-Operative and Postpartum: n/a Hemoglobin  Date Value Ref Range Status  02/18/2014 10.3* 12.0 - 15.0 g/dL Final     HCT  Date Value Ref Range Status  02/18/2014 31.2* 36.0 - 46.0 % Final    Physical Exam:  General: alert, cooperative and appears stated age 23: n/a Uterine Fundus: soft Incision: n/a DVT Evaluation: No evidence of DVT seen on physical exam. Negative Homan's sign. No cords or calf tenderness.  Discharge Diagnoses: Incompetent cervix  Discharge Information: Date: 02/18/2014 Activity: pelvic rest Diet: routine Medications: PNV and Procardia Condition: stable Instructions: refer to practice specific booklet Discharge to: home   Newborn Data: <<This patient has not yet delivered during this pregnancy.>> Home with n/a.  Lauren Diaz 02/18/2014, 1:52 PM

## 2014-02-18 NOTE — Plan of Care (Signed)
Problem: Phase I Progression Outcomes Goal: Maintains reassuring Fetal Heart Rate Outcome: Progressing Overall FHR tracing looks reassuring, unable to keep on continuous at times due to fetal/maternal position and/or movement.  Problem: Phase II Progression Outcomes Goal: Electronic fetal monitoring(Doppler,Continuous,Intermittent) EFM (Doppler, Continuous, Intermittent)  Outcome: Progressing Difficult to keep Continuous FHR at times due to maternal/fetal movement and/or position.

## 2014-02-18 NOTE — Discharge Instructions (Signed)
Call MD for T>100.4, vaginal bleeding, signs or symptoms of infection, rupture of membranes or contractions.  Come to regularly scheduled office appointment.  Daily fetal kick counts.

## 2014-02-18 NOTE — Progress Notes (Signed)
Monitoring changed to q shift

## 2014-02-18 NOTE — Progress Notes (Signed)
No complaints.  No VB or change in discharge.  No CTX.  Active FM.  VSS. AF. Gen: A&O x 3 Abd: soft, NT, gravid Ext: no c/c/e  U/S stable CL at 1.2 cm.  Appropriate growth.  Lauren Diaz [name] at [redacted]w[redacted]d with shortened cervical length; h/o PTD -s/p BMZ -Continue procardia -Discharge home with strict bedrest -Has f/u appt in office this week; will recheck CL  Mitchel Honour, DO

## 2014-03-16 ENCOUNTER — Encounter (HOSPITAL_COMMUNITY): Payer: Self-pay | Admitting: *Deleted

## 2014-03-16 ENCOUNTER — Inpatient Hospital Stay (HOSPITAL_COMMUNITY)
Admission: AD | Admit: 2014-03-16 | Discharge: 2014-03-16 | Disposition: A | Discharge status: Home / Self Care | Payer: Federal, State, Local not specified - PPO | Source: Ambulatory Visit | Attending: Obstetrics & Gynecology | Admitting: Obstetrics & Gynecology

## 2014-03-16 DIAGNOSIS — O4703 False labor before 37 completed weeks of gestation, third trimester: Secondary | ICD-10-CM

## 2014-03-16 DIAGNOSIS — O26873 Cervical shortening, third trimester: Secondary | ICD-10-CM | POA: Diagnosis not present

## 2014-03-16 DIAGNOSIS — Z3A33 33 weeks gestation of pregnancy: Secondary | ICD-10-CM | POA: Diagnosis not present

## 2014-03-16 LAB — URINE MICROSCOPIC-ADD ON

## 2014-03-16 LAB — URINALYSIS, ROUTINE W REFLEX MICROSCOPIC
Bilirubin Urine: NEGATIVE
Glucose, UA: NEGATIVE mg/dL
Hgb urine dipstick: NEGATIVE
KETONES UR: NEGATIVE mg/dL
NITRITE: NEGATIVE
Protein, ur: NEGATIVE mg/dL
SPECIFIC GRAVITY, URINE: 1.015 (ref 1.005–1.030)
UROBILINOGEN UA: 0.2 mg/dL (ref 0.0–1.0)
pH: 7 (ref 5.0–8.0)

## 2014-03-16 MED ORDER — TERBUTALINE SULFATE 1 MG/ML IJ SOLN
0.2500 mg | Freq: Once | INTRAMUSCULAR | Status: AC
  Administered 2014-03-16: 0.25 mg via SUBCUTANEOUS
  Filled 2014-03-16: qty 1

## 2014-03-16 NOTE — Progress Notes (Signed)
Vonzella NippleJulie Wenzel PA

## 2014-03-16 NOTE — Progress Notes (Signed)
Baby is active and has hiccoughs. Accel are audible with FM.

## 2014-03-16 NOTE — Discharge Instructions (Signed)
Pelvic Rest Pelvic rest is sometimes recommended for women when:   The placenta is partially or completely covering the opening of the cervix (placenta previa).  There is bleeding between the uterine wall and the amniotic sac in the first trimester (subchorionic hemorrhage).  The cervix begins to open without labor starting (incompetent cervix, cervical insufficiency).  The labor is too early (preterm labor). HOME CARE INSTRUCTIONS  Do not have sexual intercourse, stimulation, or an orgasm.  Do not use tampons, douche, or put anything in the vagina.  Do not lift anything over 10 pounds (4.5 kg).  Avoid strenuous activity or straining your pelvic muscles. SEEK MEDICAL CARE IF:  You have any vaginal bleeding during pregnancy. Treat this as a potential emergency.  You have cramping pain felt low in the stomach (stronger than menstrual cramps).  You notice vaginal discharge (watery, mucus, or bloody).  You have a low, dull backache.  There are regular contractions or uterine tightening. SEEK IMMEDIATE MEDICAL CARE IF: You have vaginal bleeding and have placenta previa.  Document Released: 09/04/2010 Document Revised: 08/02/2011 Document Reviewed: 09/04/2010 [facility] Patient Information 2015 [place], [place]. This information is not intended to replace advice given to you by your health care provider. Make sure you discuss any questions you have with your health care provider.  Braxton Hicks Contractions Contractions of the uterus can occur throughout pregnancy. Contractions are not always a sign that you are in labor.  WHAT ARE BRAXTON HICKS CONTRACTIONS?  Contractions that occur before labor are called Braxton Hicks contractions, or false labor. Toward the end of pregnancy (32-34 weeks), these contractions can develop more often and may become more forceful. This is not true labor because these contractions do not result in opening (dilatation) and thinning of the cervix. They are  sometimes difficult to tell apart from true labor because these contractions can be forceful and people have different pain tolerances. You should not feel embarrassed if you go to the hospital with false labor. Sometimes, the only way to tell if you are in true labor is for your health care provider to look for changes in the cervix. If there are no prenatal problems or other health problems associated with the pregnancy, it is completely safe to be sent home with false labor and await the onset of true labor. HOW CAN YOU TELL THE DIFFERENCE BETWEEN TRUE AND FALSE LABOR? False Labor  The contractions of false labor are usually shorter and not as hard as those of true labor.   The contractions are usually irregular.   The contractions are often felt in the front of the lower abdomen and in the groin.   The contractions may go away when you walk around or change positions while lying down.   The contractions get weaker and are shorter lasting as time goes on.   The contractions do not usually become progressively stronger, regular, and closer together as with true labor.  True Labor  Contractions in true labor last 30-70 seconds, become very regular, usually become more intense, and increase in frequency.   The contractions do not go away with walking.   The discomfort is usually felt in the top of the uterus and spreads to the lower abdomen and low back.   True labor can be determined by your health care provider with an exam. This will show that the cervix is dilating and getting thinner.  WHAT TO REMEMBER  Keep up with your usual exercises and follow other instructions given by your  health care provider.   Take medicines as directed by your health care provider.   Keep your regular prenatal appointments.   Eat and drink lightly if you think you are going into labor.   If Braxton Hicks contractions are making you uncomfortable:   Change your position from lying  down or resting to walking, or from walking to resting.   Sit and rest in a tub of warm water.   Drink 2-3 glasses of water. Dehydration may cause these contractions.   Do slow and deep breathing several times an hour.  WHEN SHOULD I SEEK IMMEDIATE MEDICAL CARE? Seek immediate medical care if:  Your contractions become stronger, more regular, and closer together.   You have fluid leaking or gushing from your vagina.   You have a fever.   You pass blood-tinged mucus.   You have vaginal bleeding.   You have continuous abdominal pain.   You have low back pain that you never had before.   You feel your baby's head pushing down and causing pelvic pressure.   Your baby is not moving as much as it used to.  Document Released: 05/10/2005 Document Revised: 05/15/2013 Document Reviewed: 02/19/2013 [facility] Patient Information 2015 [place]Care, [place]. This information is not intended to replace advice given to you by your health care provider. Make sure you discuss any questions you have with your health care provider.

## 2014-03-16 NOTE — Progress Notes (Signed)
Julie Wenzel PA in to discuss d/c plan with pt. Written and verbal d/c instructions given and understanding voiced 

## 2014-03-16 NOTE — MAU Provider Note (Signed)
History     CSN: [id number]  Arrival date and time: 03/16/14 0204   First Provider Initiated Contact with Patient 03/16/14 0232      Chief Complaint  Patient presents with  . Contractions   HPI [name] is a 23 y.o. [phone] [id number] at 7744w1d with h/o PTD at 4760w2d with last pregnancy, presents to MAU today with complaint of contractions since ~ 2200 last night. She states intercourse last around 2100 last night. She states contractions q 5-10 minutes. She rates pain at 3/10 with contractions. She denies vaginal bleeding or LOF. [facility] for cervical shortening. BMZ received on 02/15/14 and 02/16/14. Last US on 02/18/14 showed CL of 1.2 cm. Patient was given Procardia and last dose was taken a few weeks ago. Patient reports good fetal movement.    OB History   Grav Para Term Preterm Abortions TAB SAB Ect Mult Living   3 1  1 1 1  0   1      Past Medical History  Diagnosis Date  . Trichomonas   . Gonorrhea   . Chlamydia   . Preterm labor     Past Surgical History  Procedure Laterality Date  . Therapeutic abortion      Family History  Problem Relation Age of Onset  . Anesthesia problems Neg Hx   . Heart disease Father   . Cancer Maternal Grandfather     prostate  . Kidney disease Paternal Grandmother   . Diabetes Maternal Grandmother   . Kidney disease Maternal Grandmother   . Cancer Maternal Grandmother   . Cancer Maternal Uncle     History  Substance Use Topics  . Smoking status: Former Smoker -- 0.01 packs/day for 1 years    Types: Cigarettes    Quit date: 09/06/2012  . Smokeless tobacco: Never Used     Comment: quit with pos preg test  . Alcohol Use: No     Comment: weekends    Allergies:  Allergies  Allergen Reactions  . Penicillins Rash and Other (See Comments)    Childhood reaction    Prescriptions prior to admission  Medication Sig Dispense Refill  . Prenatal Vit-Fe Fumarate-FA (PRENATAL  MULTIVITAMIN) TABS tablet Take 1 tablet by mouth daily at 12 noon.      Marland Kitchen. NIFEdipine (PROCARDIA) 10 MG capsule Take 1 capsule (10 mg total) by mouth every 8 (eight) hours.  120 capsule  1    Review of Systems  Constitutional: Negative for fever and malaise/fatigue.  Gastrointestinal: Positive for abdominal pain. Negative for nausea, vomiting, diarrhea and constipation.  Genitourinary: Negative for dysuria, urgency and frequency.       Neg - vaginal bleeding, discharge, LOF   Physical Exam   Blood pressure 110/58, pulse 87, temperature 98.2 F (36.8 C), temperature source Oral, resp. rate 18, height 5\' 6"  (1.676 m), weight 214 lb 9.6 oz (97.342 kg), last menstrual period 07/27/2013, not currently breastfeeding.  Physical Exam  Constitutional: She is oriented to person, place, and time. She appears well-developed and well-nourished. No distress.  HENT:  Head: Normocephalic.  Cardiovascular: Normal rate.   Respiratory: Effort normal.  Neurological: She is alert and oriented to person, place, and time.  Skin: Skin is warm and dry. No erythema.  Psychiatric: She has a normal mood and affect.  Dilation: 1.5 Effacement (%): 60 Cervical Position: Posterior Station: Ballotable Exam by:: Vonzella NippleJulie Bich Mchaney PA   Results for orders placed during the hospital encounter  of 03/16/14 (from the past 24 hour(s))  URINALYSIS, ROUTINE W REFLEX MICROSCOPIC     Status: Abnormal   Collection Time    03/16/14  2:18 AM      Result Value Ref Range   Color, Urine YELLOW  YELLOW   APPearance CLEAR  CLEAR   Specific Gravity, Urine 1.015  1.005 - 1.030   pH 7.0  5.0 - 8.0   Glucose, UA NEGATIVE  NEGATIVE mg/dL   Hgb urine dipstick NEGATIVE  NEGATIVE   Bilirubin Urine NEGATIVE  NEGATIVE   Ketones, ur NEGATIVE  NEGATIVE mg/dL   Protein, ur NEGATIVE  NEGATIVE mg/dL   Urobilinogen, UA 0.2  0.0 - 1.0 mg/dL   Nitrite NEGATIVE  NEGATIVE   Leukocytes, UA MODERATE (*) NEGATIVE  URINE MICROSCOPIC-ADD ON     Status:  None   Collection Time    03/16/14  2:18 AM      Result Value Ref Range   Squamous Epithelial / LPF RARE  RARE   WBC, UA 3-6  <3 WBC/hpf   RBC / HPF 0-2  <3 RBC/hpf   Bacteria, UA RARE  RARE   Fetal Monitoring: Baseline: 135 bpm, moderate variability, + accelerations, no decelerations Contractions: q 2-3 minutes mild 0.25 mg Terbutaline given at 0254 Contractions: none MAU Course  Procedures None  MDM Discussed with Dr. Langston MaskerMorris. Give 0.25 mg Terbutaline. Continue monitoring and re-check in ~ 1 hour Patient reports no additional UCs. None noted on toco after Terbutaline given. Cervix unchanged.  Assessment and Plan  A: SIUP at 2837w1d Cervical insufficiency, shortening Preterm contractions  P: Discharge home Pelvic rest advised Patient advised to call the office to reschedule missed prenatal appointment from last week Preterm labor precautions discussed Patient may return to MAU as needed or if her condition were to change or worsen  Marny LowensteinJulie N Yasmen Cortner, PA-C  03/16/2014, 4:03 AM

## 2014-03-25 ENCOUNTER — Encounter (HOSPITAL_COMMUNITY): Payer: Self-pay | Admitting: *Deleted

## 2014-04-05 LAB — OB RESULTS CONSOLE GBS: GBS: POSITIVE

## 2014-05-06 ENCOUNTER — Telehealth (HOSPITAL_COMMUNITY): Payer: Self-pay | Admitting: *Deleted

## 2014-05-06 ENCOUNTER — Encounter (HOSPITAL_COMMUNITY): Payer: Self-pay | Admitting: *Deleted

## 2014-05-06 NOTE — Telephone Encounter (Signed)
Preadmission screen  

## 2014-05-07 ENCOUNTER — Inpatient Hospital Stay (HOSPITAL_COMMUNITY)
Admission: RE | Admit: 2014-05-07 | Discharge: 2014-05-10 | DRG: 775 | Disposition: A | Discharge status: Home / Self Care | Payer: Federal, State, Local not specified - PPO | Source: Ambulatory Visit | Attending: Obstetrics and Gynecology | Admitting: Obstetrics and Gynecology

## 2014-05-07 ENCOUNTER — Encounter (HOSPITAL_COMMUNITY): Payer: Self-pay

## 2014-05-07 DIAGNOSIS — Z87891 Personal history of nicotine dependence: Secondary | ICD-10-CM

## 2014-05-07 DIAGNOSIS — Z3A40 40 weeks gestation of pregnancy: Secondary | ICD-10-CM | POA: Diagnosis present

## 2014-05-07 DIAGNOSIS — Z8249 Family history of ischemic heart disease and other diseases of the circulatory system: Secondary | ICD-10-CM | POA: Diagnosis not present

## 2014-05-07 DIAGNOSIS — O480 Post-term pregnancy: Secondary | ICD-10-CM | POA: Diagnosis present

## 2014-05-07 LAB — CBC
HEMATOCRIT: 32.4 % — AB (ref 36.0–46.0)
HEMOGLOBIN: 10.4 g/dL — AB (ref 12.0–15.0)
MCH: 29.2 pg (ref 26.0–34.0)
MCHC: 32.1 g/dL (ref 30.0–36.0)
MCV: 91 fL (ref 78.0–100.0)
Platelets: 259 10*3/uL (ref 150–400)
RBC: 3.56 MIL/uL — ABNORMAL LOW (ref 3.87–5.11)
RDW: 13.6 % (ref 11.5–15.5)
WBC: 12.2 10*3/uL — ABNORMAL HIGH (ref 4.0–10.5)

## 2014-05-07 MED ORDER — OXYTOCIN 40 UNITS IN LACTATED RINGERS INFUSION - SIMPLE MED
62.5000 mL/h | INTRAVENOUS | Status: DC
  Administered 2014-05-08: 62.5 mL/h via INTRAVENOUS

## 2014-05-07 MED ORDER — FLEET ENEMA 7-19 GM/118ML RE ENEM: 1.0000 | ENEMA | RECTAL | Status: DC | PRN

## 2014-05-07 MED ORDER — MISOPROSTOL 25 MCG QUARTER TABLET
25.0000 ug | ORAL_TABLET | ORAL | Status: DC | PRN
  Administered 2014-05-07 – 2014-05-08 (×3): 25 ug via VAGINAL
  Filled 2014-05-07 (×2): qty 0.25
  Filled 2014-05-07: qty 1
  Filled 2014-05-07: qty 0.25

## 2014-05-07 MED ORDER — CLINDAMYCIN PHOSPHATE 900 MG/50ML IV SOLN
900.0000 mg | Freq: Three times a day (TID) | INTRAVENOUS | Status: DC
  Administered 2014-05-07 – 2014-05-08 (×3): 900 mg via INTRAVENOUS
  Filled 2014-05-07 (×6): qty 50

## 2014-05-07 MED ORDER — ACETAMINOPHEN 325 MG PO TABS: 650.0000 mg | ORAL_TABLET | ORAL | Status: DC | PRN

## 2014-05-07 MED ORDER — CITRIC ACID-SODIUM CITRATE 334-500 MG/5ML PO SOLN: 30.0000 mL | ORAL | Status: DC | PRN

## 2014-05-07 MED ORDER — OXYCODONE-ACETAMINOPHEN 5-325 MG PO TABS: 2.0000 | ORAL_TABLET | ORAL | Status: DC | PRN

## 2014-05-07 MED ORDER — TERBUTALINE SULFATE 1 MG/ML IJ SOLN: 0.2500 mg | Freq: Once | INTRAMUSCULAR | Status: AC | PRN

## 2014-05-07 MED ORDER — ZOLPIDEM TARTRATE 5 MG PO TABS: 5.0000 mg | ORAL_TABLET | Freq: Every evening | ORAL | Status: DC | PRN

## 2014-05-07 MED ORDER — ONDANSETRON HCL 4 MG/2ML IJ SOLN
4.0000 mg | Freq: Four times a day (QID) | INTRAMUSCULAR | Status: DC | PRN
Start: 2014-05-07 — End: 2014-05-10

## 2014-05-07 MED ORDER — OXYCODONE-ACETAMINOPHEN 5-325 MG PO TABS: 1.0000 | ORAL_TABLET | ORAL | Status: DC | PRN

## 2014-05-07 MED ORDER — OXYTOCIN BOLUS FROM INFUSION
500.0000 mL | INTRAVENOUS | Status: DC
  Administered 2014-05-08: 500 mL via INTRAVENOUS

## 2014-05-07 MED ORDER — LACTATED RINGERS IV SOLN
INTRAVENOUS | Status: DC
  Administered 2014-05-07 – 2014-05-08 (×3): via INTRAVENOUS
  Administered 2014-05-08: 125 mL/h via INTRAVENOUS

## 2014-05-07 MED ORDER — LIDOCAINE HCL (PF) 1 % IJ SOLN
30.0000 mL | INTRAMUSCULAR | Status: DC | PRN
Start: 2014-05-07 — End: 2014-05-10
  Filled 2014-05-07: qty 30

## 2014-05-07 MED ORDER — LACTATED RINGERS IV SOLN
500.0000 mL | INTRAVENOUS | Status: DC | PRN
  Administered 2014-05-07: 500 mL via INTRAVENOUS

## 2014-05-07 MED ORDER — BUTORPHANOL TARTRATE 1 MG/ML IJ SOLN: 1.0000 mg | INTRAMUSCULAR | Status: DC | PRN

## 2014-05-07 NOTE — H&P (Signed)
Lauren Diaz is a 23 y.o. female presenting for IOIL. No ROM or bleeding. Maternal Medical History:  Fetal activity: Perceived fetal activity is normal.      OB History    Gravida Para Term Preterm AB TAB SAB Ectopic Multiple Living   3 1  1 1 1  0   1     Past Medical History  Diagnosis Date  . Trichomonas   . Gonorrhea   . Chlamydia   . Preterm labor    Past Surgical History  Procedure Laterality Date  . Therapeutic abortion     Family History: family history includes Cancer in her maternal grandfather, maternal grandmother, and maternal uncle; Diabetes in her maternal grandmother; Heart disease in her father; Kidney disease in her maternal grandmother and paternal grandmother. There is no history of Anesthesia problems. Social History:  reports that she quit smoking about 20 months ago. Her smoking use included Cigarettes. She has a .01 pack-year smoking history. She has never used smokeless tobacco. She reports that she does not drink alcohol or use illicit drugs.   Prenatal Transfer Tool  Maternal Diabetes: No Genetic Screening: Normal Maternal Ultrasounds/Referrals: Normal Fetal Ultrasounds or other Referrals:  None Maternal Substance Abuse:  No Significant Maternal Medications:  None Significant Maternal Lab Results:  None Other Comments:  None  Review of Systems  Eyes: Negative for blurred vision.  Gastrointestinal: Negative for abdominal pain.  Neurological: Negative for headaches.    Dilation: 2 Effacement (%): Thick Station: -3 Exam by:: Dr Henderson Cloudomblin Blood pressure 114/64, pulse 90, temperature 98.5 F (36.9 C), resp. rate 20, height 5\' 6"  (1.676 m), weight 218 lb (98.884 kg), last menstrual period 07/27/2013, not currently breastfeeding. Maternal Exam:  Abdomen: Fetal presentation: vertex     Fetal Exam Fetal State Assessment: Category I - tracings are normal.     Physical Exam  Cardiovascular: Normal rate and regular rhythm.   Respiratory:  Effort normal and breath sounds normal.  GI: Soft. Bowel sounds are normal.  Neurological: She has normal reflexes.    Prenatal labs: ABO, Rh: O/Positive/-- (05/19 0000) Antibody: Negative (05/19 0000) Rubella: Immune (05/19 0000) RPR: Nonreactive (05/19 0000)  HBsAg: Negative (05/19 0000)  HIV: Non-reactive (05/19 0000)  GBS: Positive (11/13 0000)   Assessment/Plan: 23 yo G3P1 @ 40 4/7 weeks for two stage induction D/W patient induction and risks including fetal distress, failed induction, cesarean section Patient states she understands and agrees All questions answered   Yoko Mcgahee II,Aileen Amore E 05/07/2014, 9:40 PM

## 2014-05-08 ENCOUNTER — Inpatient Hospital Stay (HOSPITAL_COMMUNITY): Payer: Federal, State, Local not specified - PPO | Admitting: Anesthesiology

## 2014-05-08 ENCOUNTER — Encounter (HOSPITAL_COMMUNITY): Payer: Self-pay

## 2014-05-08 LAB — TYPE AND SCREEN
ABO/RH(D): O POS
ANTIBODY SCREEN: NEGATIVE

## 2014-05-08 LAB — WET PREP, GENITAL
CLUE CELLS WET PREP: NONE SEEN
Trich, Wet Prep: NONE SEEN
Yeast Wet Prep HPF POC: NONE SEEN

## 2014-05-08 LAB — RPR

## 2014-05-08 MED ORDER — ONDANSETRON HCL 4 MG PO TABS: 4.0000 mg | ORAL_TABLET | ORAL | Status: DC | PRN

## 2014-05-08 MED ORDER — OXYTOCIN 40 UNITS IN LACTATED RINGERS INFUSION - SIMPLE MED
1.0000 m[IU]/min | INTRAVENOUS | Status: DC
  Administered 2014-05-08: 2 m[IU]/min via INTRAVENOUS
  Filled 2014-05-08: qty 1000

## 2014-05-08 MED ORDER — PHENYLEPHRINE 40 MCG/ML (10ML) SYRINGE FOR IV PUSH (FOR BLOOD PRESSURE SUPPORT)
80.0000 ug | PREFILLED_SYRINGE | INTRAVENOUS | Status: DC | PRN
  Administered 2014-05-08 (×2): 80 ug via INTRAVENOUS
  Filled 2014-05-08: qty 2

## 2014-05-08 MED ORDER — SENNOSIDES-DOCUSATE SODIUM 8.6-50 MG PO TABS
2.0000 | ORAL_TABLET | ORAL | Status: DC
  Administered 2014-05-08 – 2014-05-10 (×2): 2 via ORAL
  Filled 2014-05-08 (×2): qty 2

## 2014-05-08 MED ORDER — PRENATAL MULTIVITAMIN CH: 1.0000 | ORAL_TABLET | Freq: Every day | ORAL | Status: DC

## 2014-05-08 MED ORDER — TETANUS-DIPHTH-ACELL PERTUSSIS 5-2.5-18.5 LF-MCG/0.5 IM SUSP: 0.5000 mL | Freq: Once | INTRAMUSCULAR | Status: DC

## 2014-05-08 MED ORDER — PHENYLEPHRINE 40 MCG/ML (10ML) SYRINGE FOR IV PUSH (FOR BLOOD PRESSURE SUPPORT)
80.0000 ug | PREFILLED_SYRINGE | INTRAVENOUS | Status: DC | PRN
  Filled 2014-05-08: qty 2
  Filled 2014-05-08 (×2): qty 10

## 2014-05-08 MED ORDER — LACTATED RINGERS IV SOLN
500.0000 mL | Freq: Once | INTRAVENOUS | Status: AC
  Administered 2014-05-08: 500 mL via INTRAVENOUS

## 2014-05-08 MED ORDER — BENZOCAINE-MENTHOL 20-0.5 % EX AERO
1.0000 "application " | INHALATION_SPRAY | CUTANEOUS | Status: DC | PRN
  Administered 2014-05-08 – 2014-05-09 (×2): 1 via TOPICAL
  Filled 2014-05-08 (×2): qty 56

## 2014-05-08 MED ORDER — DIPHENHYDRAMINE HCL 50 MG/ML IJ SOLN: 12.5000 mg | INTRAMUSCULAR | Status: DC | PRN

## 2014-05-08 MED ORDER — IBUPROFEN 600 MG PO TABS: 600.0000 mg | ORAL_TABLET | Freq: Four times a day (QID) | ORAL | Status: DC

## 2014-05-08 MED ORDER — SIMETHICONE 80 MG PO CHEW: 80.0000 mg | CHEWABLE_TABLET | ORAL | Status: DC | PRN

## 2014-05-08 MED ORDER — EPHEDRINE 5 MG/ML INJ
10.0000 mg | INTRAVENOUS | Status: DC | PRN
  Filled 2014-05-08: qty 4
  Filled 2014-05-08: qty 2

## 2014-05-08 MED ORDER — MEDROXYPROGESTERONE ACETATE 150 MG/ML IM SUSP: 150.0000 mg | INTRAMUSCULAR | Status: DC | PRN

## 2014-05-08 MED ORDER — WITCH HAZEL-GLYCERIN EX PADS: 1.0000 "application " | MEDICATED_PAD | CUTANEOUS | Status: DC | PRN

## 2014-05-08 MED ORDER — OXYCODONE-ACETAMINOPHEN 5-325 MG PO TABS
1.0000 | ORAL_TABLET | ORAL | Status: DC | PRN
  Filled 2014-05-08: qty 1

## 2014-05-08 MED ORDER — DIPHENHYDRAMINE HCL 25 MG PO CAPS: 25.0000 mg | ORAL_CAPSULE | Freq: Four times a day (QID) | ORAL | Status: DC | PRN

## 2014-05-08 MED ORDER — ONDANSETRON HCL 4 MG/2ML IJ SOLN: 4.0000 mg | INTRAMUSCULAR | Status: DC | PRN

## 2014-05-08 MED ORDER — LANOLIN HYDROUS EX OINT: TOPICAL_OINTMENT | CUTANEOUS | Status: DC | PRN

## 2014-05-08 MED ORDER — FENTANYL 2.5 MCG/ML BUPIVACAINE 1/10 % EPIDURAL INFUSION (WH - ANES)
14.0000 mL/h | INTRAMUSCULAR | Status: DC | PRN
  Administered 2014-05-08: 14 mL/h via EPIDURAL
  Filled 2014-05-08: qty 125

## 2014-05-08 MED ORDER — OXYCODONE-ACETAMINOPHEN 5-325 MG PO TABS: 2.0000 | ORAL_TABLET | ORAL | Status: DC | PRN

## 2014-05-08 MED ORDER — IBUPROFEN 100 MG/5ML PO SUSP
600.0000 mg | Freq: Four times a day (QID) | ORAL | Status: DC
  Administered 2014-05-08 – 2014-05-10 (×7): 600 mg via ORAL
  Filled 2014-05-08 (×8): qty 30

## 2014-05-08 MED ORDER — TERBUTALINE SULFATE 1 MG/ML IJ SOLN: 0.2500 mg | Freq: Once | INTRAMUSCULAR | Status: DC | PRN

## 2014-05-08 MED ORDER — LIDOCAINE HCL (PF) 1 % IJ SOLN
INTRAMUSCULAR | Status: DC | PRN
  Administered 2014-05-08 (×4): 4 mL

## 2014-05-08 MED ORDER — MEASLES, MUMPS & RUBELLA VAC ~~LOC~~ INJ: 0.5000 mL | INJECTION | Freq: Once | SUBCUTANEOUS | Status: DC

## 2014-05-08 MED ORDER — DIBUCAINE 1 % RE OINT: 1.0000 "application " | TOPICAL_OINTMENT | RECTAL | Status: DC | PRN

## 2014-05-08 MED ORDER — EPHEDRINE 5 MG/ML INJ
10.0000 mg | INTRAVENOUS | Status: DC | PRN
  Administered 2014-05-08: 10 mg via INTRAVENOUS
  Filled 2014-05-08: qty 2

## 2014-05-08 NOTE — Progress Notes (Signed)
Pt comfortable, feeling mild cramps w/ ctx  FHT reassuring Toco irregular Cvx 2/th/-3  A/P:  cytotec given at 615 Unable to arom Will recheck in a couple hours and try arom Pitocin at 1030

## 2014-05-08 NOTE — Anesthesia Preprocedure Evaluation (Signed)
Anesthesia Evaluation  Patient identified by MRN, date of birth, ID band Patient awake    Reviewed: Allergy & Precautions, H&P , NPO status , Patient's Chart, lab work & pertinent test results, reviewed documented beta blocker date and time   History of Anesthesia Complications Negative for: history of anesthetic complications  Airway Mallampati: I  TM Distance: >3 FB Neck ROM: full    Dental  (+) Teeth Intact   Pulmonary neg pulmonary ROS, former smoker,  breath sounds clear to auscultation        Cardiovascular negative cardio ROS  Rhythm:regular Rate:Normal     Neuro/Psych negative neurological ROS  negative psych ROS   GI/Hepatic negative GI ROS, Neg liver ROS,   Endo/Other  negative endocrine ROSBMI 35.3  Renal/GU negative Renal ROS  Female GU complaint     Musculoskeletal   Abdominal   Peds  Hematology negative hematology ROS (+) anemia ,   Anesthesia Other Findings   Reproductive/Obstetrics (+) Pregnancy                             Anesthesia Physical Anesthesia Plan  ASA: II  Anesthesia Plan: Epidural   Post-op Pain Management:    Induction:   Airway Management Planned:   Additional Equipment:   Intra-op Plan:   Post-operative Plan:   Informed Consent: I have reviewed the patients History and Physical, chart, labs and discussed the procedure including the risks, benefits and alternatives for the proposed anesthesia with the patient or authorized representative who has indicated his/her understanding and acceptance.     Plan Discussed with:   Anesthesia Plan Comments:         Anesthesia Quick Evaluation

## 2014-05-08 NOTE — Anesthesia Procedure Notes (Signed)
Epidural Patient location during procedure: OB Start time: 05/08/2014 11:00 AM  Staffing Anesthesiologist: Nyzir Dubois Performed by: anesthesiologist   Preanesthetic Checklist Completed: patient identified, site marked, surgical consent, pre-op evaluation, timeout performed, IV checked, risks and benefits discussed and monitors and equipment checked  Epidural Patient position: sitting Prep: site prepped and draped and DuraPrep Patient monitoring: continuous pulse ox and blood pressure Approach: midline Location: L3-L4 Injection technique: LOR air  Needle:  Needle type: Tuohy  Needle gauge: 17 G Needle length: 9 cm and 9 Needle insertion depth: 6 cm Catheter type: closed end flexible Catheter size: 19 Gauge Catheter at skin depth: 11 cm Test dose: negative  Assessment Events: blood not aspirated, injection not painful, no injection resistance, negative IV test and no paresthesia  Additional Notes Discussed risk of headache, infection, bleeding, nerve injury and failed or incomplete block.  Patient voices understanding and wishes to proceed.  Epidural placed easily on first pass.  No paresthesia.  Patient tolerated procedure well with no apparent complications.  A. Lauren Diaz. MDReason for block:procedure for pain

## 2014-05-08 NOTE — Progress Notes (Signed)
SVD of vigorous female infant w/ apgars of 8,9.  Placenta delivered spontaneous w/ 3VC.   1st degree lac repaired w/ 3-0 vicryl rapide.  Fundus firm.  EBL 350cc .  Mom and baby stable in couplet care

## 2014-05-08 NOTE — Progress Notes (Signed)
Pt feeling ctx, mild  FHT reassuring Toco Q5 Cvx 3/60/-2 AROM - clear  A/P:  Continue induction pitocin

## 2014-05-09 LAB — CBC
HCT: 28.9 % — ABNORMAL LOW (ref 36.0–46.0)
Hemoglobin: 9.5 g/dL — ABNORMAL LOW (ref 12.0–15.0)
MCH: 29.7 pg (ref 26.0–34.0)
MCHC: 32.9 g/dL (ref 30.0–36.0)
MCV: 90.3 fL (ref 78.0–100.0)
Platelets: 219 10*3/uL (ref 150–400)
RBC: 3.2 MIL/uL — ABNORMAL LOW (ref 3.87–5.11)
RDW: 13.7 % (ref 11.5–15.5)
WBC: 14.8 10*3/uL — ABNORMAL HIGH (ref 4.0–10.5)

## 2014-05-09 MED ORDER — ACETAMINOPHEN 160 MG/5ML PO SOLN
650.0000 mg | ORAL | Status: DC | PRN
  Administered 2014-05-09 – 2014-05-10 (×2): 650 mg via ORAL
  Filled 2014-05-09 (×2): qty 20.3

## 2014-05-09 MED ORDER — OXYCODONE HCL 5 MG/5ML PO SOLN
5.0000 mg | ORAL | Status: DC | PRN
  Administered 2014-05-09 (×3): 5 mg via ORAL
  Filled 2014-05-09 (×3): qty 5

## 2014-05-09 MED ORDER — OXYCODONE HCL 5 MG/5ML PO SOLN
10.0000 mg | ORAL | Status: DC | PRN
  Administered 2014-05-09 – 2014-05-10 (×2): 10 mg via ORAL
  Filled 2014-05-09 (×2): qty 10

## 2014-05-09 MED ORDER — COMPLETENATE 29-1 MG PO CHEW
1.0000 | CHEWABLE_TABLET | Freq: Every day | ORAL | Status: DC
Start: 2014-05-09 — End: 2014-05-10
  Administered 2014-05-09: 1 via ORAL
  Filled 2014-05-09 (×2): qty 1

## 2014-05-09 MED ORDER — ACETAMINOPHEN 160 MG/5ML PO SOLN
325.0000 mg | ORAL | Status: DC | PRN
  Administered 2014-05-09: 650 mg via ORAL
  Filled 2014-05-09: qty 20.3

## 2014-05-09 NOTE — Anesthesia Postprocedure Evaluation (Signed)
Anesthesia Post Note  Patient: ChiropodistAmber S Benfer  Procedure(s) Performed: * No procedures listed *  Anesthesia type: Epidural  Patient location: Mother/Baby  Post pain: Pain level controlled  Post assessment: Post-op Vital signs reviewed  Last Vitals:  Filed Vitals:   05/08/14 2348  BP: 113/62  Pulse: 81  Temp: 36.8 C  Resp: 18    Post vital signs: Reviewed  Level of consciousness:alert  Complications: No apparent anesthesia complications

## 2014-05-09 NOTE — Progress Notes (Signed)
Post Partum Day 1 Subjective: no complaints, up ad lib, voiding and tolerating PO  Objective: Blood pressure 113/62, pulse 81, temperature 98.2 F (36.8 C), temperature source Oral, resp. rate 18, height 5\' 6"  (1.676 m), weight 218 lb (98.884 kg), last menstrual period 07/27/2013, SpO2 98 %, unknown if currently breastfeeding.  Physical Exam:  General: alert and cooperative Lochia: appropriate Uterine Fundus: firm Incision: healing well DVT Evaluation: No evidence of DVT seen on physical exam. Negative Homan's sign. No cords or calf tenderness.   Recent Labs  05/07/14 2140 05/09/14 0600  HGB 10.4* 9.5*  HCT 32.4* 28.9*    Assessment/Plan: Plan for discharge tomorrow   LOS: 2 days   Arnel Wymer G 05/09/2014, 8:11 AM

## 2014-05-10 MED ORDER — OXYCODONE HCL 5 MG/5ML PO SOLN: 5.0000 mg | ORAL | Status: DC | PRN

## 2014-05-10 MED ORDER — ACETAMINOPHEN 160 MG/5ML PO SOLN: 325.0000 mg | ORAL | Status: DC | PRN

## 2014-05-10 MED ORDER — IBUPROFEN 100 MG/5ML PO SUSP: 600.0000 mg | Freq: Four times a day (QID) | ORAL | Status: DC

## 2014-05-10 NOTE — Discharge Summary (Signed)
Obstetric Discharge Summary Reason for Admission: induction of labor Prenatal Procedures: ultrasound Intrapartum Procedures: spontaneous vaginal delivery Postpartum Procedures: none Complications-Operative and Postpartum: 1 degree perineal laceration HEMOGLOBIN  Date Value Ref Range Status  05/09/2014 9.5* 12.0 - 15.0 g/dL Final   HCT  Date Value Ref Range Status  05/09/2014 28.9* 36.0 - 46.0 % Final    Physical Exam:  General: alert and cooperative Lochia: appropriate Uterine Fundus: firm Incision: healing well DVT Evaluation: No evidence of DVT seen on physical exam. Negative Homan's sign. No cords or calf tenderness. No significant calf/ankle edema.  Discharge Diagnoses: Term Pregnancy-delivered  Discharge Information: Date: 05/10/2014 Activity: pelvic rest Diet: routine Medications: PNV, Ibuprofen and Percocet Condition: stable Instructions: refer to practice specific booklet Discharge to: home   Newborn Data: Live born female  Birth Weight: 8 lb 14.2 oz (4030 g) APGAR: 8, 9  Home with mother.  Azul Brumett G 05/10/2014, 8:07 AM

## 2014-05-30 IMAGING — US US OB COMP LESS 14 WK
1 series · 13 of 28 positions shown · non-contrast
Comparison: none

[Series 1: us ob comp less 14 wks · 13 of 28 slices shown]
[im 2/28]
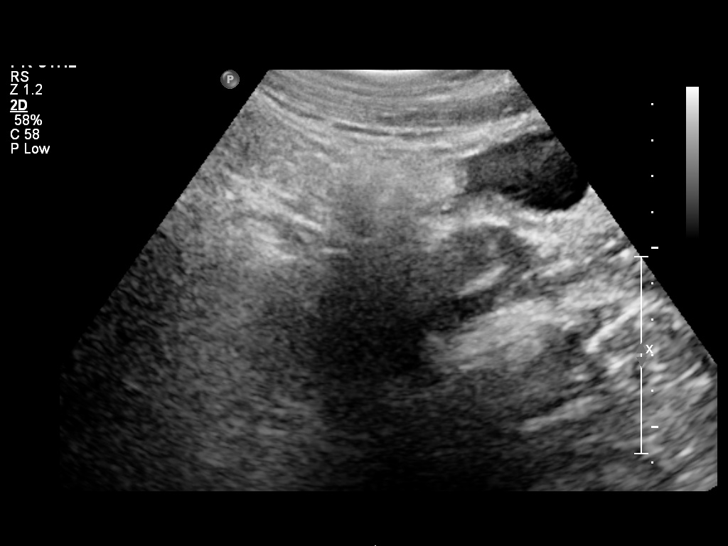
[im 4/28]
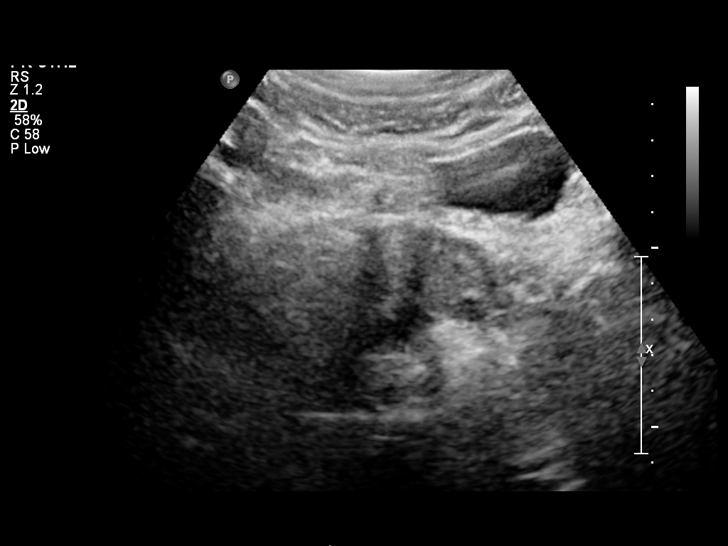
[im 6/28]
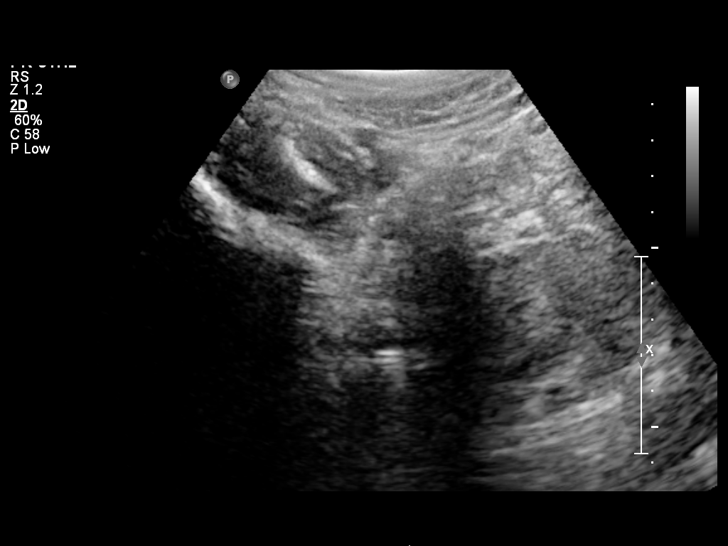
[im 8/28]
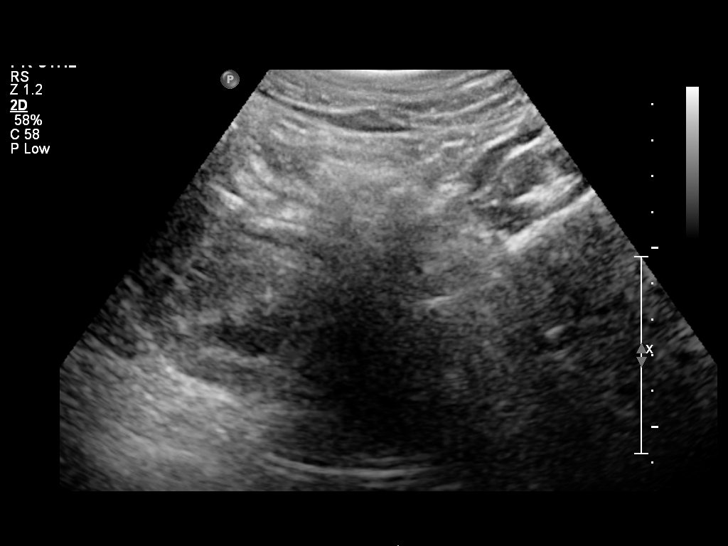
[im 10/28]
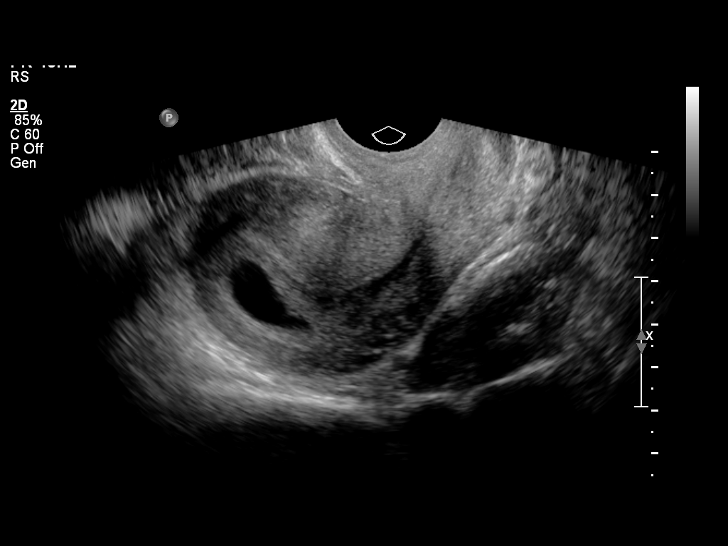
[im 12/28]
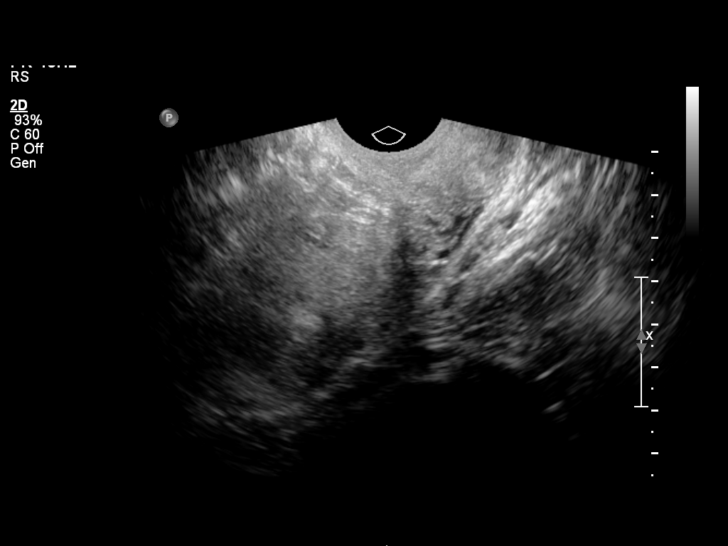
[im 15/28]
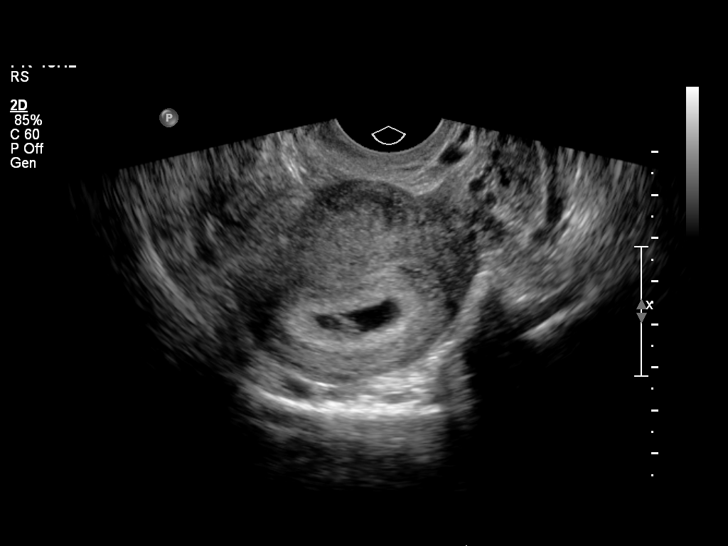
[im 17/28]
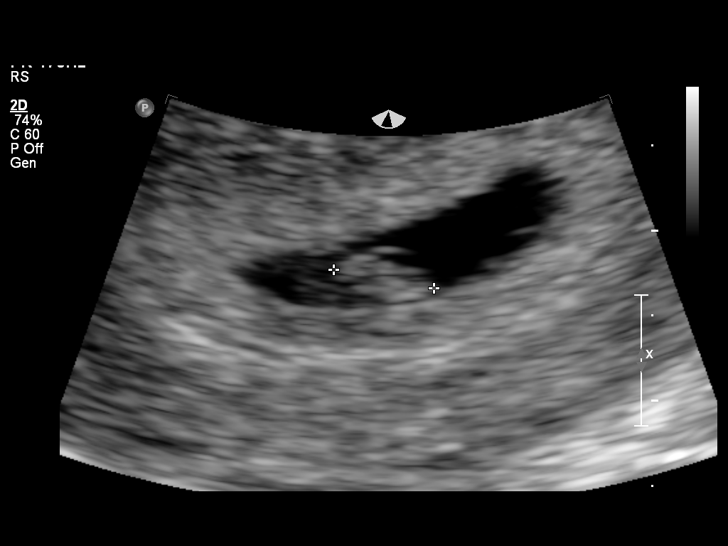
[im 19/28]
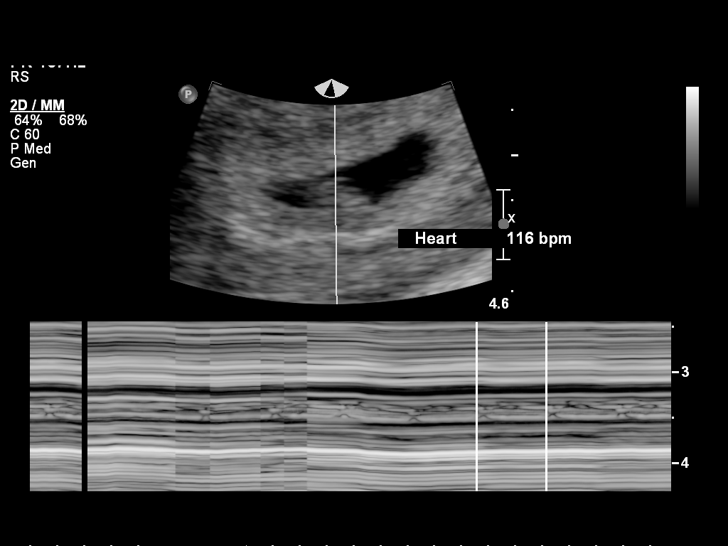
[im 21/28]
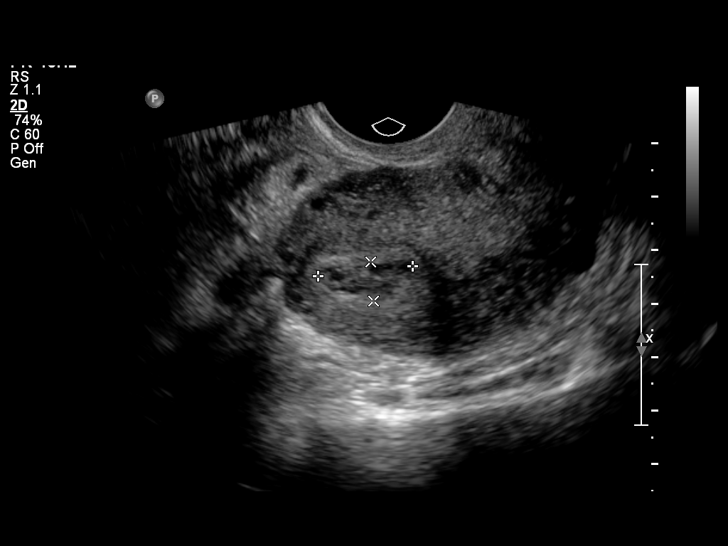
[im 23/28]
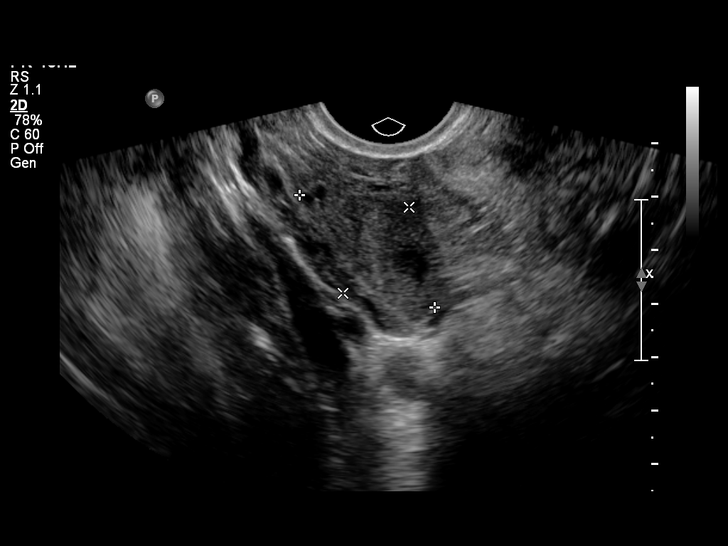
[im 25/28]
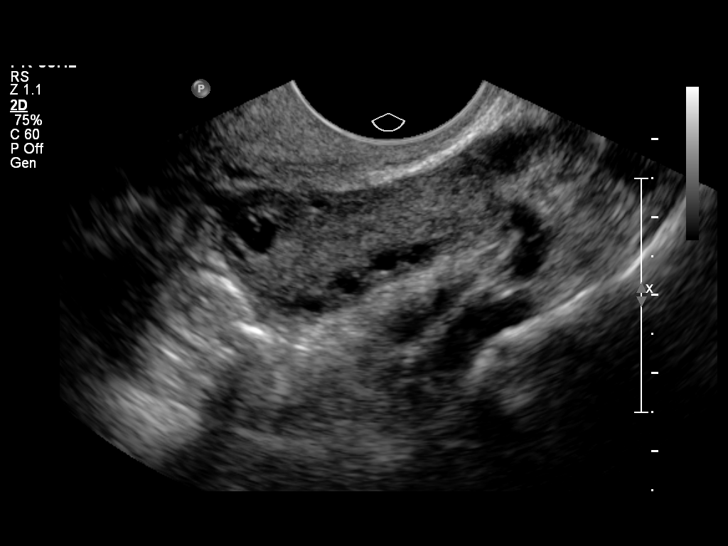
[im 27/28]
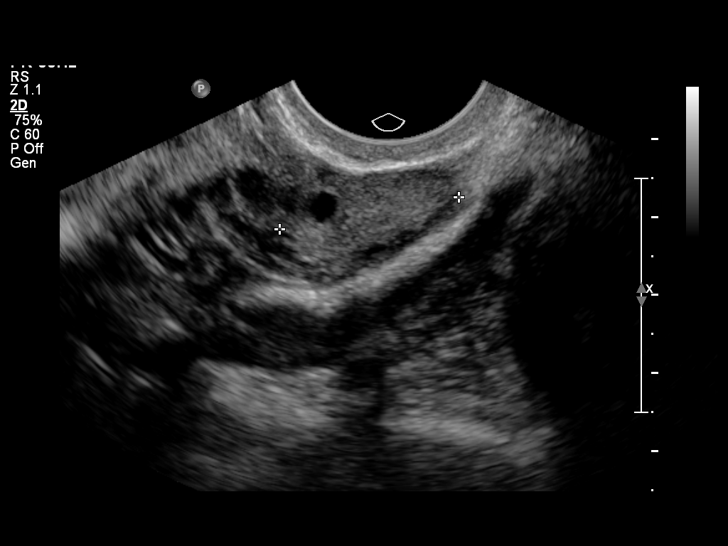

[13 of 28 positions shown; findings below may reference images not displayed]

OBSTETRICS REPORT
                      (Signed Final 09/30/2012 [DATE])

Service(s) Provided

 US OB COMP LESS 14 WKS                                76801.0
 US OB TRANSVAGINAL                                    76817.0
Indications

 Vaginal bleeding, unknown etiology
Fetal Evaluation

 Num Of Fetuses:    1
 Preg. Location:    Intrauterine
 Gest. Sac:         Intrauterine, small
                    subchorionic bleed
 Yolk Sac:          Visualized
 Fetal Pole:        Visualized
 Fetal Heart Rate:  116                         bpm
 Cardiac Activity:  Observed
Biometry

 CRL:      6.1  mm    G. Age:   6w 3d                  EDD:   05/23/13
Gestational Age

 LMP:           6w 2d        Date:   08/17/12                 EDD:   05/24/13
 Best:          6w 2d     Det. By:   LMP  (08/17/12)          EDD:   05/24/13
Cervix Uterus Adnexa

 Cervix:       Normal appearance by transvaginal scan
 Left Ovary:   Within normal limits.
 Right Ovary:  Within normal limits. Small corpus luteum noted.

 Adnexa:     No abnormality visualized.
Impression

 Intrauterine gestational sac, yolk sac, fetal pole, and cardiac
 activity noted.  Concordant GA by CRL and assigned GA by
 LMP.

## 2014-08-03 ENCOUNTER — Inpatient Hospital Stay (HOSPITAL_COMMUNITY)
Admission: AD | Admit: 2014-08-03 | Discharge: 2014-08-03 | Disposition: A | Discharge status: Home / Self Care | Payer: Federal, State, Local not specified - PPO | Source: Ambulatory Visit | Attending: Obstetrics and Gynecology | Admitting: Obstetrics and Gynecology

## 2014-08-03 ENCOUNTER — Encounter (HOSPITAL_COMMUNITY): Payer: Self-pay

## 2014-08-03 DIAGNOSIS — Z3201 Encounter for pregnancy test, result positive: Secondary | ICD-10-CM | POA: Insufficient documentation

## 2014-08-03 DIAGNOSIS — Z3200 Encounter for pregnancy test, result unknown: Secondary | ICD-10-CM | POA: Diagnosis present

## 2014-08-03 DIAGNOSIS — Z87891 Personal history of nicotine dependence: Secondary | ICD-10-CM | POA: Diagnosis not present

## 2014-08-03 LAB — URINE MICROSCOPIC-ADD ON

## 2014-08-03 LAB — URINALYSIS, ROUTINE W REFLEX MICROSCOPIC
BILIRUBIN URINE: NEGATIVE
Glucose, UA: NEGATIVE mg/dL
HGB URINE DIPSTICK: NEGATIVE
Ketones, ur: NEGATIVE mg/dL
Leukocytes, UA: NEGATIVE
Nitrite: POSITIVE — AB
PH: 6 (ref 5.0–8.0)
PROTEIN: NEGATIVE mg/dL
SPECIFIC GRAVITY, URINE: 1.02 (ref 1.005–1.030)
UROBILINOGEN UA: 0.2 mg/dL (ref 0.0–1.0)

## 2014-08-03 LAB — POCT PREGNANCY, URINE: Preg Test, Ur: POSITIVE — AB

## 2014-08-03 MED ORDER — PRENATAL VITAMINS PLUS 27-1 MG PO TABS: 1.0000 | ORAL_TABLET | Freq: Every day | ORAL | Status: DC

## 2014-08-03 NOTE — MAU Note (Signed)
No pain, bleeding, or abnormal discharge.

## 2014-08-03 NOTE — MAU Note (Signed)
Pt states had +upt Thursday at home. Here for confirmation and dating.

## 2014-08-03 NOTE — MAU Provider Note (Signed)
History     CSN: [id number]  Arrival date and time: 08/03/14 0910   None     No chief complaint on file.  HPI Lauren Diaz 24 y.o. [redacted]w[redacted]d  Came to confirm her pregnancy and find out how far along she is in this pregnancy.  Denies having any problems.  OB History    Gravida Para Term Preterm AB TAB SAB Ectopic Multiple Living   0  0 2      Past Medical History  Diagnosis Date  . Trichomonas   . Gonorrhea   . Chlamydia   . Preterm labor     Past Surgical History  Procedure Laterality Date  . Therapeutic abortion      Family History  Problem Relation Age of Onset  . Anesthesia problems Neg Hx   . Heart disease Father   . Cancer Maternal Grandfather     prostate  . Kidney disease Paternal Grandmother   . Diabetes Maternal Grandmother   . Kidney disease Maternal Grandmother   . Cancer Maternal Grandmother   . Cancer Maternal Uncle     History  Substance Use Topics  . Smoking status: Former Smoker -- 0.01 packs/day for 1 years    Types: Cigarettes    Quit date: 09/06/2012  . Smokeless tobacco: Never Used     Comment: quit with pos preg test  . Alcohol Use: No     Comment: weekends    Allergies:  Allergies  Allergen Reactions  . Penicillins Rash and Other (See Comments)    Childhood reaction    Prescriptions prior to admission  Medication Sig Dispense Refill Last Dose  . acetaminophen (TYLENOL) 160 MG/5ML solution Take 10.2 mLs (325 mg total) by mouth every 4 (four) hours as needed for moderate pain (for pain scale <7). 120 mL 0   . ibuprofen (ADVIL,MOTRIN) 100 MG/5ML suspension Take 30 mLs (600 mg total) by mouth every 6 (six) hours. 473 mL 0   . oxyCODONE (ROXICODONE) 5 MG/5ML solution Take 5 mLs (5 mg total) by mouth every 4 (four) hours as needed for moderate pain (for pain scale <7). 473 mL 0   . Prenatal Vit-Fe Fumarate-FA (PRENATAL MULTIVITAMIN) TABS tablet Take 1 tablet by mouth daily at 12 noon.   05/06/2014 at Unknown time     Review of Systems  Constitutional: Negative for fever.  Gastrointestinal: Negative for nausea, vomiting and abdominal pain.  Genitourinary:       No vaginal discharge. No vaginal bleeding. No dysuria.   Physical Exam   Blood pressure 120/67, pulse 80, temperature 98 F (36.7 C), temperature source Oral, resp. rate 18, height  (1.651 m), weight 205 lb 2 oz (93.044 kg), last menstrual period 06/28/2014, not currently breastfeeding.  Physical Exam  Nursing note and vitals reviewed. Constitutional: She is oriented to person, place, and time. She appears well-developed and well-nourished.  HENT:  Head: Normocephalic.  Eyes: EOM are normal.  Neck: Neck supple.  Musculoskeletal: Normal range of motion.  Neurological: She is alert and oriented to person, place, and time.  Skin: Skin is warm and dry.  Psychiatric: She has a normal mood and affect.    MAU Course  Procedures  MDM No further exam is needed as client has not complaints.  Assessment and Plan  Postive pregnancy test, first trimester  Plan Will eprescribe prenatal vitamins. Reviewed no smoking, no alcohol, no drugs Advised to make appointment with her provider ASAP.  She  has a insurance and plans to see her doctor.   Pleasant Britz 08/03/2014, 9:42 AM

## 2014-09-01 ENCOUNTER — Encounter (HOSPITAL_COMMUNITY): Payer: Self-pay | Admitting: Emergency Medicine

## 2014-09-01 ENCOUNTER — Emergency Department (HOSPITAL_COMMUNITY): Payer: Federal, State, Local not specified - PPO

## 2014-09-01 ENCOUNTER — Emergency Department (HOSPITAL_COMMUNITY)
Admission: EM | Admit: 2014-09-01 | Discharge: 2014-09-01 | Disposition: A | Discharge status: Home / Self Care | Payer: Federal, State, Local not specified - PPO | Attending: Emergency Medicine | Admitting: Emergency Medicine

## 2014-09-01 DIAGNOSIS — Z8619 Personal history of other infectious and parasitic diseases: Secondary | ICD-10-CM | POA: Diagnosis not present

## 2014-09-01 DIAGNOSIS — O210 Mild hyperemesis gravidarum: Secondary | ICD-10-CM | POA: Insufficient documentation

## 2014-09-01 DIAGNOSIS — Z79899 Other long term (current) drug therapy: Secondary | ICD-10-CM | POA: Diagnosis not present

## 2014-09-01 DIAGNOSIS — R1110 Vomiting, unspecified: Secondary | ICD-10-CM

## 2014-09-01 DIAGNOSIS — Z87891 Personal history of nicotine dependence: Secondary | ICD-10-CM | POA: Diagnosis not present

## 2014-09-01 DIAGNOSIS — Z3A00 Weeks of gestation of pregnancy not specified: Secondary | ICD-10-CM | POA: Insufficient documentation

## 2014-09-01 DIAGNOSIS — R112 Nausea with vomiting, unspecified: Secondary | ICD-10-CM

## 2014-09-01 DIAGNOSIS — R7989 Other specified abnormal findings of blood chemistry: Secondary | ICD-10-CM

## 2014-09-01 DIAGNOSIS — Z880 Allergy status to penicillin: Secondary | ICD-10-CM | POA: Insufficient documentation

## 2014-09-01 DIAGNOSIS — Z8751 Personal history of pre-term labor: Secondary | ICD-10-CM | POA: Insufficient documentation

## 2014-09-01 DIAGNOSIS — E349 Endocrine disorder, unspecified: Secondary | ICD-10-CM

## 2014-09-01 LAB — URINALYSIS, ROUTINE W REFLEX MICROSCOPIC
BILIRUBIN URINE: NEGATIVE
Glucose, UA: NEGATIVE mg/dL
HGB URINE DIPSTICK: NEGATIVE
Ketones, ur: NEGATIVE mg/dL
NITRITE: NEGATIVE
PH: 7.5 (ref 5.0–8.0)
Protein, ur: NEGATIVE mg/dL
Specific Gravity, Urine: 1.013 (ref 1.005–1.030)
Urobilinogen, UA: 0.2 mg/dL (ref 0.0–1.0)

## 2014-09-01 LAB — URINE MICROSCOPIC-ADD ON

## 2014-09-01 LAB — CBC WITH DIFFERENTIAL/PLATELET
BASOS PCT: 0 % (ref 0–1)
Basophils Absolute: 0 10*3/uL (ref 0.0–0.1)
Eosinophils Absolute: 0 10*3/uL (ref 0.0–0.7)
Eosinophils Relative: 0 % (ref 0–5)
HCT: 36.9 % (ref 36.0–46.0)
Hemoglobin: 12.2 g/dL (ref 12.0–15.0)
LYMPHS ABS: 2.8 10*3/uL (ref 0.7–4.0)
Lymphocytes Relative: 18 % (ref 12–46)
MCH: 29.8 pg (ref 26.0–34.0)
MCHC: 33.1 g/dL (ref 30.0–36.0)
MCV: 90.2 fL (ref 78.0–100.0)
MONO ABS: 1.2 10*3/uL — AB (ref 0.1–1.0)
Monocytes Relative: 8 % (ref 3–12)
Neutro Abs: 11.3 10*3/uL — ABNORMAL HIGH (ref 1.7–7.7)
Neutrophils Relative %: 74 % (ref 43–77)
Platelets: 344 10*3/uL (ref 150–400)
RBC: 4.09 MIL/uL (ref 3.87–5.11)
RDW: 13.5 % (ref 11.5–15.5)
WBC: 15.3 10*3/uL — ABNORMAL HIGH (ref 4.0–10.5)

## 2014-09-01 LAB — COMPREHENSIVE METABOLIC PANEL
ALBUMIN: 3.5 g/dL (ref 3.5–5.2)
ALT: 15 U/L (ref 0–35)
AST: 31 U/L (ref 0–37)
Alkaline Phosphatase: 53 U/L (ref 39–117)
Anion gap: 11 (ref 5–15)
BILIRUBIN TOTAL: 1.1 mg/dL (ref 0.3–1.2)
BUN: 7 mg/dL (ref 6–23)
CO2: 22 mmol/L (ref 19–32)
CREATININE: 0.77 mg/dL (ref 0.50–1.10)
Calcium: 8.9 mg/dL (ref 8.4–10.5)
Chloride: 104 mmol/L (ref 96–112)
GFR calc Af Amer: >90 mL/min (ref >90)
Glucose, Bld: 111 mg/dL — ABNORMAL HIGH (ref 70–99)
POTASSIUM: 4.6 mmol/L (ref 3.5–5.1)
Sodium: 137 mmol/L (ref 135–145)
TOTAL PROTEIN: 6.9 g/dL (ref 6.0–8.3)

## 2014-09-01 LAB — HCG, QUANTITATIVE, PREGNANCY: hCG, Beta Chain, Quant, S: 4552 m[IU]/mL — ABNORMAL HIGH (ref <5)

## 2014-09-01 LAB — POC URINE PREG, ED: Preg Test, Ur: POSITIVE — AB

## 2014-09-01 LAB — LIPASE, BLOOD: Lipase: 22 U/L (ref 11–59)

## 2014-09-01 MED ORDER — FAMOTIDINE 20 MG PO TABS: 20.0000 mg | ORAL_TABLET | Freq: Two times a day (BID) | ORAL | Status: DC

## 2014-09-01 MED ORDER — ONDANSETRON 4 MG PO TBDP: 4.0000 mg | ORAL_TABLET | Freq: Three times a day (TID) | ORAL | Status: DC | PRN

## 2014-09-01 MED ORDER — SODIUM CHLORIDE 0.9 % IV BOLUS (SEPSIS)
1000.0000 mL | Freq: Once | INTRAVENOUS | Status: AC
  Administered 2014-09-01: 1000 mL via INTRAVENOUS

## 2014-09-01 MED ORDER — IBUPROFEN 200 MG PO TABS
600.0000 mg | ORAL_TABLET | Freq: Once | ORAL | Status: AC
  Administered 2014-09-01: 600 mg via ORAL
  Filled 2014-09-01: qty 3

## 2014-09-01 MED ORDER — ONDANSETRON HCL 4 MG/2ML IJ SOLN: 4.0000 mg | Freq: Once | INTRAMUSCULAR | Status: AC

## 2014-09-01 MED ORDER — ONDANSETRON HCL 4 MG/2ML IJ SOLN
4.0000 mg | Freq: Once | INTRAMUSCULAR | Status: AC
  Administered 2014-09-01: 4 mg via INTRAVENOUS
  Filled 2014-09-01: qty 2

## 2014-09-01 MED ORDER — ONDANSETRON HCL 4 MG/2ML IJ SOLN
4.0000 mg | Freq: Once | INTRAMUSCULAR | Status: AC
  Administered 2014-09-01: 4 mg via INTRAVENOUS

## 2014-09-01 MED ORDER — PROMETHAZINE HCL 25 MG/ML IJ SOLN
25.0000 mg | Freq: Once | INTRAMUSCULAR | Status: AC
  Administered 2014-09-01: 25 mg via INTRAVENOUS
  Filled 2014-09-01: qty 1

## 2014-09-01 MED ORDER — ACETAMINOPHEN 325 MG PO TABS
650.0000 mg | ORAL_TABLET | Freq: Once | ORAL | Status: AC
  Administered 2014-09-01: 650 mg via ORAL
  Filled 2014-09-01: qty 2

## 2014-09-01 NOTE — ED Notes (Signed)
Pt arrives via EMS from home, N/V thaqt woke her up thhis morning at 0400. Mid abd pain, stabbing, 6/10. States son had same illness earlier this week. Vomiting bile in triage.

## 2014-09-01 NOTE — ED Notes (Signed)
Pt given ginger ale. Instructed to take sips as tolerated

## 2014-09-01 NOTE — ED Notes (Signed)
Pt attempted sips of gingerale. Became nauseated and vomited x1. PA aware

## 2014-09-01 NOTE — ED Provider Notes (Signed)
CSN: [id number]     Arrival date & time 09/01/14  9147 History   First MD Initiated Contact with Patient 09/01/14 [phone]     Chief Complaint  Patient presents with  . Emesis  . Abdominal Pain     (Consider location/radiation/quality/duration/timing/severity/associated sxs/prior Treatment) HPI Comments: Patients with recent chemical abortion on 03/28 without complications at approximately [redacted] weeks gestation, no past surgical history -- presents with complaints of nausea and vomiting starting at 4 AM. Patient awoke from sleep with multiple doses of vomiting. She complains of sharp epigastric pain that does not radiate which occurs only when she is vomiting and then eases off. She has not had any diarrhea to this point. No fever, URI symptoms, chest pain or shortness of breath. No vaginal bleeding or discharge. No treatments prior to arrival. Patient has a child at home that had similar symptoms. He was seen in emergency department and treated with fluids and Zofran. Symptoms have improved however child has some residual diarrhea. No recent travel or new foods. The onset of this condition was acute. The course is constant. Aggravating factors: none. Alleviating factors: none.  Patient is a 24 y.o. female presenting with vomiting and abdominal pain. The history is provided by the patient.  Emesis Associated symptoms: abdominal pain (with vomiting only)   Associated symptoms: no diarrhea, no headaches, no myalgias and no sore throat   Abdominal Pain Associated symptoms: nausea and vomiting   Associated symptoms: no chest pain, no cough, no diarrhea, no dysuria, no fever, no sore throat, no vaginal bleeding and no vaginal discharge     Past Medical History  Diagnosis Date  . Trichomonas   . Gonorrhea   . Chlamydia   . Preterm labor    Past Surgical History  Procedure Laterality Date  . Therapeutic abortion     Family History  Problem Relation Age of Onset  . Anesthesia problems Neg Hx   .  Heart disease Father   . Cancer Maternal Grandfather     prostate  . Kidney disease Paternal Grandmother   . Diabetes Maternal Grandmother   . Kidney disease Maternal Grandmother   . Cancer Maternal Grandmother   . Cancer Maternal Uncle    History  Substance Use Topics  . Smoking status: Former Smoker -- 0.01 packs/day for 1 years    Types: Cigarettes    Quit date: 09/06/2012  . Smokeless tobacco: Never Used     Comment: quit with pos preg test  . Alcohol Use: No     Comment: weekends   OB History    Gravida Para Term Preterm AB TAB SAB Ectopic Multiple Living   0  0 2     Review of Systems  Constitutional: Negative for fever.  HENT: Negative for rhinorrhea and sore throat.   Eyes: Negative for redness.  Respiratory: Negative for cough.   Cardiovascular: Negative for chest pain.  Gastrointestinal: Positive for nausea, vomiting and abdominal pain (with vomiting only). Negative for diarrhea.  Genitourinary: Negative for dysuria, vaginal bleeding and vaginal discharge.  Musculoskeletal: Negative for myalgias.  Skin: Negative for rash.  Neurological: Negative for headaches.   Allergies  Penicillins  Home Medications   Prior to Admission medications   Medication Sig Start Date End Date Taking? Authorizing Provider  acetaminophen (TYLENOL) 160 MG/5ML solution Take 10.2 mLs (325 mg total) by mouth every 4 (four) hours as needed for moderate pain (for pain scale <7). 05/10/14   Julio Sicks,  NP  Prenatal Vit-Fe Fumarate-FA (PRENATAL MULTIVITAMIN) TABS tablet Take 1 tablet by mouth daily at 12 noon.    Historical Provider, MD  Prenatal Vit-Fe Fumarate-FA (PRENATAL VITAMINS PLUS) 27-1 MG TABS Take 1 tablet by mouth daily. Generic is OK - Prenatal vitamin with 1 mg Folic acid 08/03/14   Currie Paris, NP   BP 113/76 mmHg  Pulse 120  Temp(Src) 99.3 F (37.4 C) (Oral)  Resp 18  SpO2 99%  LMP 06/28/2014 (Approximate)  Breastfeeding? Unknown   Physical Exam   Constitutional: She appears well-developed and well-nourished.  HENT:  Head: Normocephalic and atraumatic.  Eyes: Conjunctivae are normal. Right eye exhibits no discharge. Left eye exhibits no discharge.  Neck: Normal range of motion. Neck supple.  Cardiovascular: Normal rate, regular rhythm and normal heart sounds.   No murmur heard. Pulmonary/Chest: Effort normal and breath sounds normal.  Abdominal: Soft. Bowel sounds are normal. She exhibits no distension. There is no tenderness. There is no rebound, no guarding and no CVA tenderness.  No tenderness at time of exam.   Neurological: She is alert.  Skin: Skin is warm and dry.  Psychiatric: She has a normal mood and affect.  Nursing note and vitals reviewed.   ED Course  Procedures (including critical care time) Labs Review Labs Reviewed  CBC WITH DIFFERENTIAL/PLATELET - Abnormal; Notable for the following:    WBC 15.3 (*)    Neutro Abs 11.3 (*)    Monocytes Absolute 1.2 (*)    All other components within normal limits  COMPREHENSIVE METABOLIC PANEL - Abnormal; Notable for the following:    Glucose, Bld 111 (*)    All other components within normal limits  URINALYSIS, ROUTINE W REFLEX MICROSCOPIC - Abnormal; Notable for the following:    APPearance HAZY (*)    Leukocytes, UA SMALL (*)    All other components within normal limits  HCG, QUANTITATIVE, PREGNANCY - Abnormal; Notable for the following:    hCG, Beta Chain, Quant, S 4552 (*)    All other components within normal limits  URINE MICROSCOPIC-ADD ON - Abnormal; Notable for the following:    Squamous Epithelial / LPF FEW (*)    Bacteria, UA MANY (*)    All other components within normal limits  POC URINE PREG, ED - Abnormal; Notable for the following:    Preg Test, Ur POSITIVE (*)    All other components within normal limits  LIPASE, BLOOD    Imaging Review US Ob Comp Less 14 Wks  09/01/2014   CLINICAL DATA:  Abdominal pain with nausea and vomiting. Recent  chemical abortion 1 week ago. Quantitative beta HCG 4,552.  EXAM: OBSTETRIC <14 WK [place] AND TRANSVAGINAL OB US  TECHNIQUE: Both transabdominal and transvaginal ultrasound examinations were performed for complete evaluation of the gestation as well as the maternal uterus, adnexal regions, and pelvic cul-de-sac. Transvaginal technique was performed to assess early pregnancy.  COMPARISON:  None.  FINDINGS: Intrauterine gestational sac: Not visualized.  Yolk sac:  Not visualized.  Embryo:  Not visualized.  Cardiac Activity: Not visualized.  Heart Rate: Not visualized.  MSD: Not visualized. CRL:  Not visualized.  Maternal uterus/adnexae: Uterus measures 5.5 x 7.6 x 8.3 cm. There is a mildly thickened endometrium containing heterogeneous debris measuring 15 mm in thickness likely hemorrhagic debris from patient's recent abortion. No significant internal vascularity.  Ovaries are normal in size, shape position with normal color flow. No significant free pelvic fluid.  IMPRESSION: Mildly thickened heterogeneous endometrium likely representing hemorrhagic  debris from patient's recent chemical abortion. Endometritis or retained products are not excluded. Recommend correlation with serial quantitative beta HCG and follow-up ultrasound as indicated.   Electronically Signed   By: Elberta Fortisaniel  Boyle M.D.   On: 09/01/2014 13:37   [place] Ob Transvaginal  09/01/2014   CLINICAL DATA:  Abdominal pain with nausea and vomiting. Recent chemical abortion 1 week ago. Quantitative beta HCG 4,552.  EXAM: OBSTETRIC <14 WK US AND TRANSVAGINAL OB US  TECHNIQUE: Both transabdominal and transvaginal ultrasound examinations were performed for complete evaluation of the gestation as well as the maternal uterus, adnexal regions, and pelvic cul-de-sac. Transvaginal technique was performed to assess early pregnancy.  COMPARISON:  None.  FINDINGS: Intrauterine gestational sac: Not visualized.  Yolk sac:  Not visualized.  Embryo:  Not visualized.  Cardiac  Activity: Not visualized.  Heart Rate: Not visualized.  MSD: Not visualized. CRL:  Not visualized.  Maternal uterus/adnexae: Uterus measures 5.5 x 7.6 x 8.3 cm. There is a mildly thickened endometrium containing heterogeneous debris measuring 15 mm in thickness likely hemorrhagic debris from patient's recent abortion. No significant internal vascularity.  Ovaries are normal in size, shape position with normal color flow. No significant free pelvic fluid.  IMPRESSION: Mildly thickened heterogeneous endometrium likely representing hemorrhagic debris from patient's recent chemical abortion. Endometritis or retained products are not excluded. Recommend correlation with serial quantitative beta HCG and follow-up ultrasound as indicated.   Electronically Signed   By: Elberta Fortisaniel  Boyle M.D.   On: 09/01/2014 13:37     EKG Interpretation None       6:24 AM Patient seen and examined. Work-up initiated. Medications ordered. Labs sent previously by nursing protocol. Will ensure no pregnancy.   Vital signs reviewed and are as follows: BP 113/76 mmHg  Pulse 120  Temp(Src) 99.3 F (37.4 C) (Oral)  Resp 18  SpO2 99%  LMP 06/28/2014 (Approximate)  Breastfeeding? Unknown  11:30 AM Patient discussed with Dr. Manus Gunningancour previously. Patient has had additional episodes of vomiting. Given elevated quant, will repeat US to ensure no retained products.   3:16 PM Pt now tolerating PO's. Dr. Manus Gunningancour has spoken with Dr. Shawnie PonsPratt of OB/GYN for reccs. Given no vaginal or pelvic sx, patient needs to f/u, preferably with [place] in [place], in 2 days for a recheck of quant to ensure that it is trending down.   Patient informed of this plan. She was told to go to [facility] in 48 hrs if [facility] not [facility].  Discussed clear liquid diet. The patient was urged to return to the Emergency Department immediately with worsening of current symptoms, worsening abdominal pain, persistent vomiting, blood noted in stools, fever, or any  other concerns. The patient verbalized understanding.     MDM   Final diagnoses:  Vomiting  Non-intractable vomiting with nausea, vomiting of unspecified type  Elevated serum hCG   Nausea and vomiting, epigastric pain: Patient with symptoms consistent with viral gastroenteritis. Vitals are stable, low-grade fever. No signs of dehydration, tolerating PO's. Lungs are clear. No focal abdominal pain, no concern for appendicitis, cholecystitis, pancreatitis, ruptured viscus, UTI, kidney stone, or any other abdominal etiology. Supportive therapy indicated with return if symptoms worsen. Patient counseled.  Elevated beta hcg: Status post therapeutic abortion 12 days ago. Patient has no vaginal or pelvic complaints at this time. She is not tender or having vaginal bleeding. Discussed appropriate follow-up with on-call OB/GYN.     Renne CriglerJoshua Kerrie Timm, PA-C 09/01/14 1521  Glynn OctaveStephen Rancour, MD 09/01/14  1853 

## 2014-09-01 NOTE — ED Notes (Signed)
PA at bedside.

## 2014-09-01 NOTE — Discharge Instructions (Signed)
Please read and follow all provided instructions.  Your diagnoses today include:  1. Non-intractable vomiting with nausea, vomiting of unspecified type   2. Vomiting   3. Elevated serum hCG     Tests performed today include:  Blood counts and electrolytes  Blood tests to check liver and kidney function  Blood tests to check pancreas function  Urine test to look for infection and pregnancy (in women)  Ultrasound - shows possible retained fetal material from your abortion  Beta hcg test - today your level was 4552  Vital signs. See below for your results today.   Medications prescribed:   Zofran (ondansetron) - for nausea and vomiting   Pepcid (famotidine) - antihistamine  You can find this medication over-the-counter.   DO NOT exceed:   20mg  Pepcid every 12 hours  Take any prescribed medications only as directed.  Home care instructions:   Follow any educational materials contained in this packet.   Your abdominal pain, nausea, vomiting, and diarrhea may be caused by a viral gastroenteritis also called 'stomach flu'. You should rest for the next several days. Keep drinking plenty of fluids and use the medicine for nausea as directed.    Drink clear liquids for the next 24 hours and introduce solid foods slowly after 24 hours using the b.r.a.t. diet (Bananas, Rice, Applesauce, Toast, Yogurt).    Follow-up instructions: Please follow-up with your GYN doctor in the next 2 days for further evaluation. You will need to have your beta hCG test performed at this time to ensure that it is getting lower (it was 4552 today).   Return instructions:  SEEK IMMEDIATE MEDICAL ATTENTION IF:  If you have pain that does not go away or becomes severe   A temperature above 101F develops   Repeated vomiting occurs (multiple episodes)   If you have pain that becomes localized to portions of the abdomen. The right side could possibly be appendicitis. In an adult, the left lower  portion of the abdomen could be colitis or diverticulitis.   Blood is being passed in stools or vomit (bright red or black tarry stools)   You develop chest pain, difficulty breathing, dizziness or fainting, or become confused, poorly responsive, or inconsolable (young children)  If you have any other emergent concerns regarding your health  Additional Information: Abdominal (belly) pain can be caused by many things. Your caregiver performed an examination and possibly ordered blood/urine tests and imaging (CT scan, x-rays, ultrasound). Many cases can be observed and treated at home after initial evaluation in the emergency department. Even though you are being discharged home, abdominal pain can be unpredictable. Therefore, you need a repeated exam if your pain does not resolve, returns, or worsens. Most patients with abdominal pain don't have to be admitted to the hospital or have surgery, but serious problems like appendicitis and gallbladder attacks can start out as nonspecific pain. Many abdominal conditions cannot be diagnosed in one visit, so follow-up evaluations are very important.  Your vital signs today were: BP 100/83 mmHg   Pulse 108   Temp(Src) 100.3 F (37.9 C) (Oral)   Resp 16   SpO2 100%   LMP 06/28/2014 (Approximate)   Breastfeeding? Unknown If your blood pressure (bp) was elevated above 135/85 this visit, please have this repeated by your doctor within one month. --------------

## 2014-11-24 IMAGING — US US OB COMP +14 WK
1 series · 12 of 28 positions shown · non-contrast
Comparison: none

[Series 1: us ob comp +14 wk · 12 of 75 slices shown]
[im 3/75]
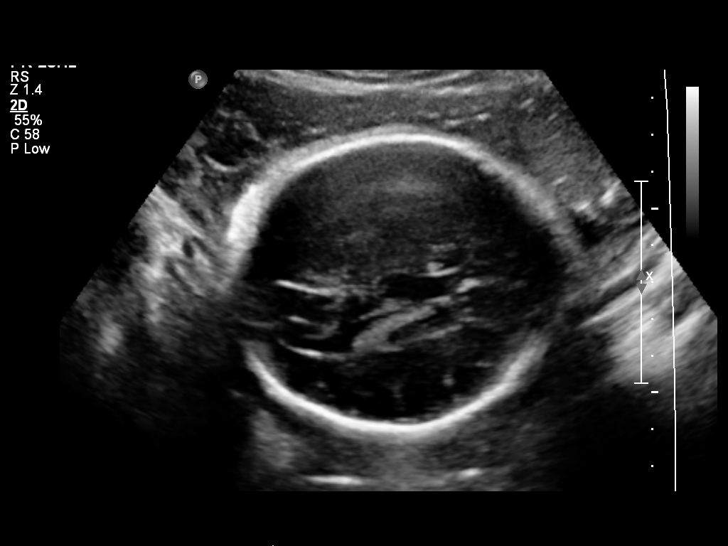
[im 9/75]
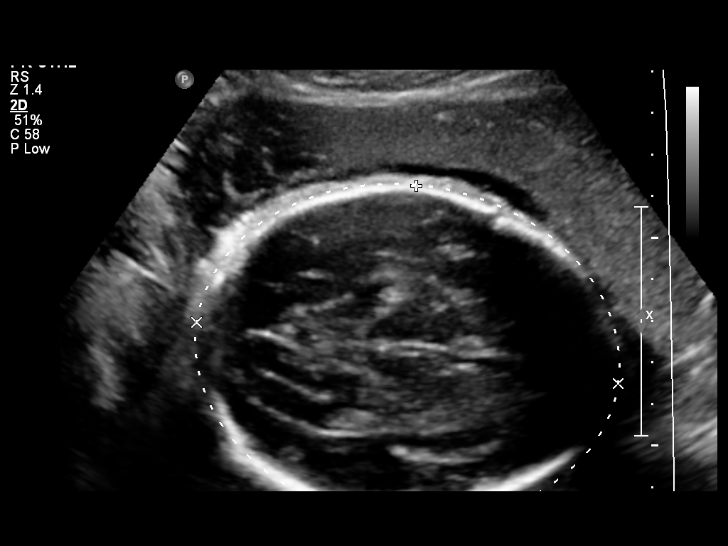
[im 14/75]
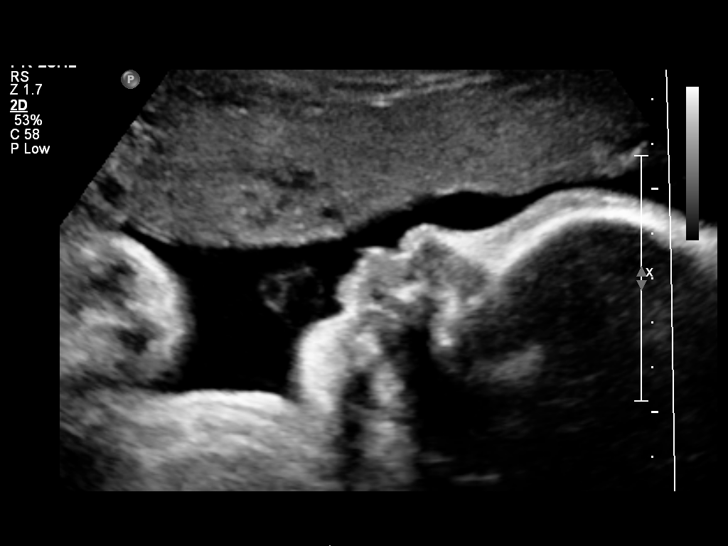
[im 22/75]
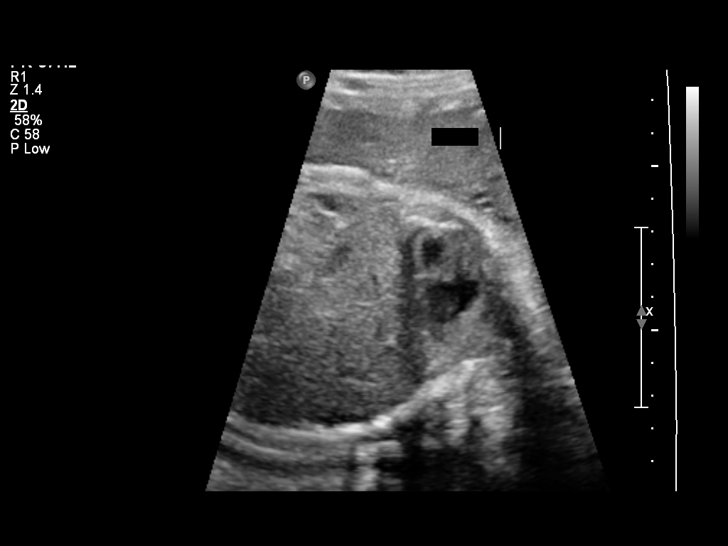
[im 28/75]
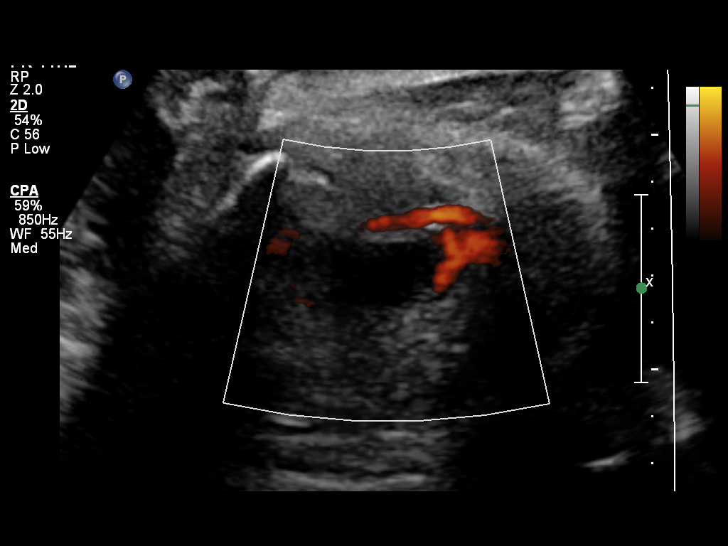
[im 33/75]
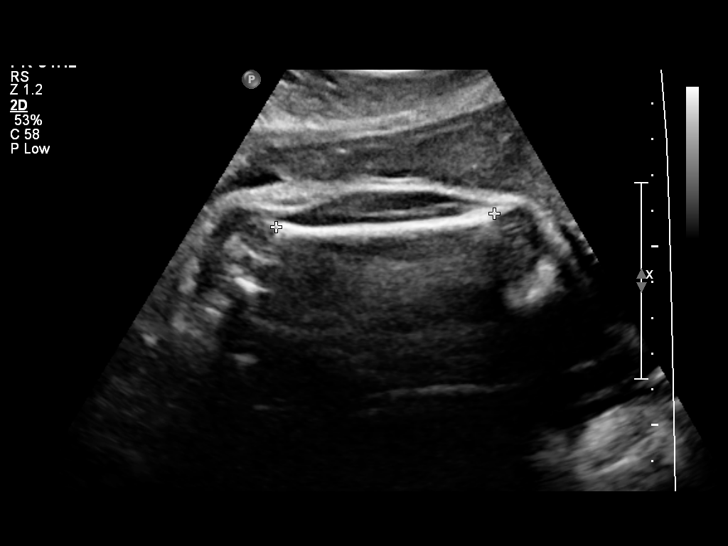
[im 42/75]
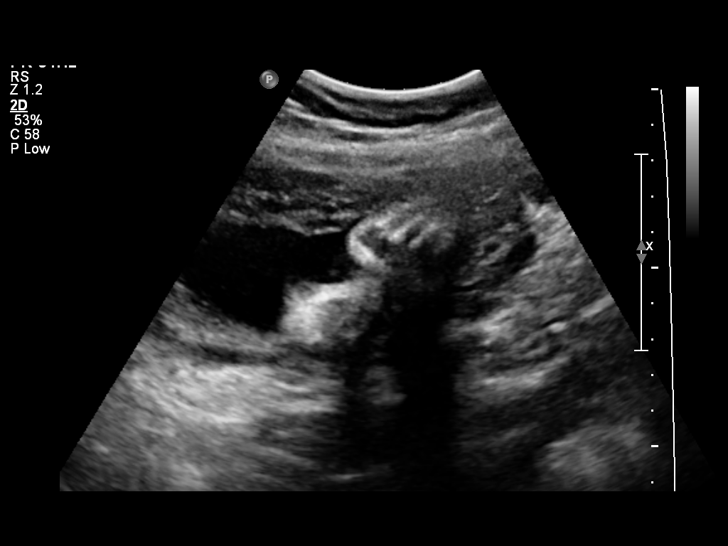
[im 47/75]
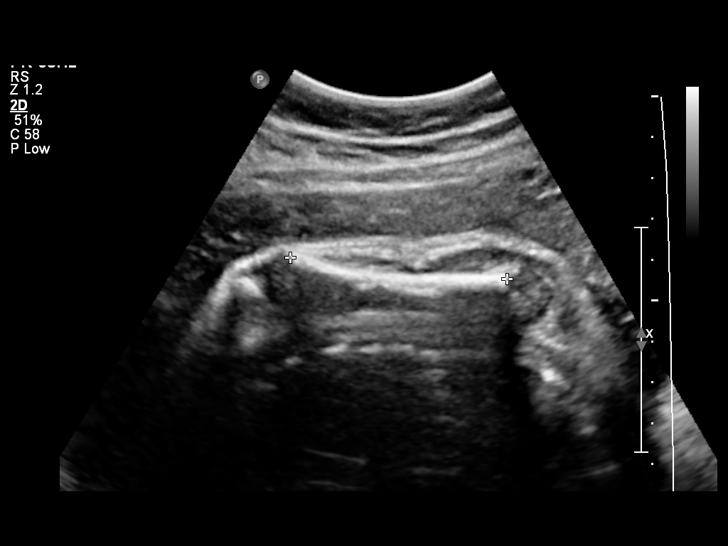
[im 53/75]
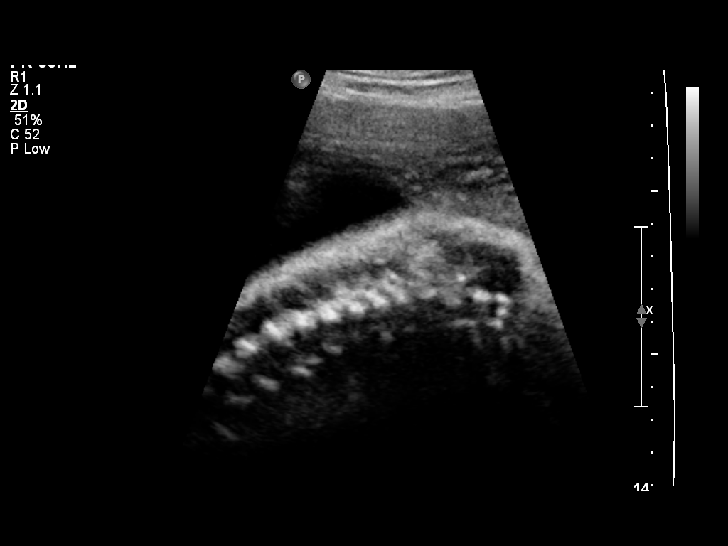
[im 61/75]
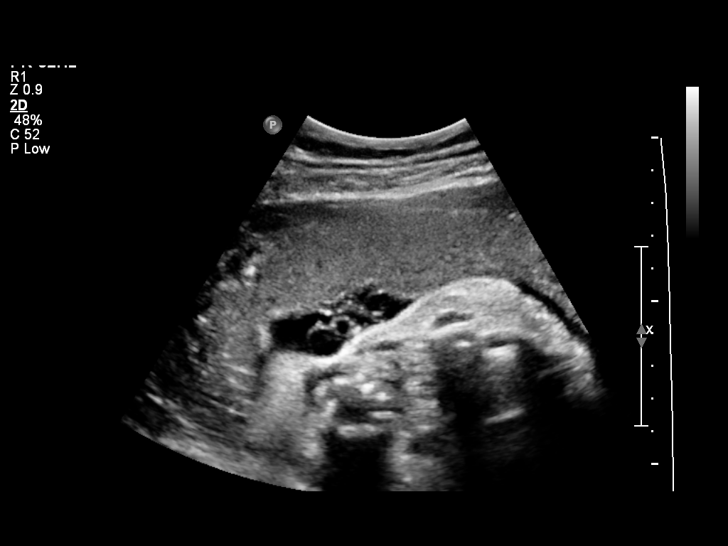
[im 66/75]
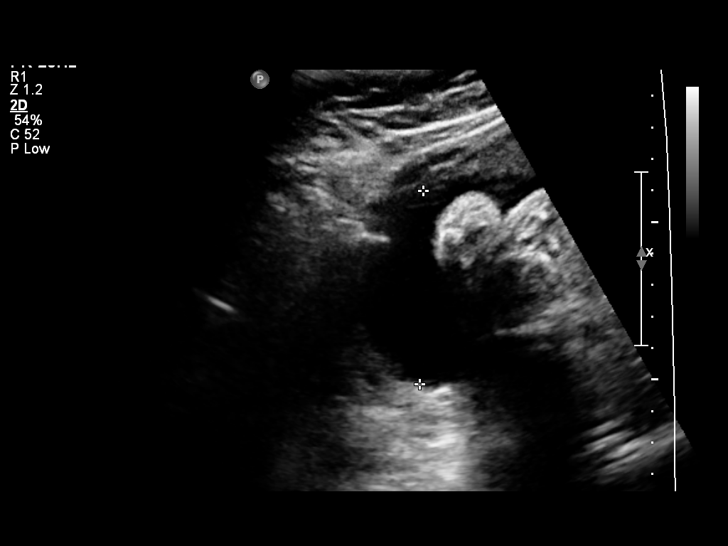
[im 72/75]
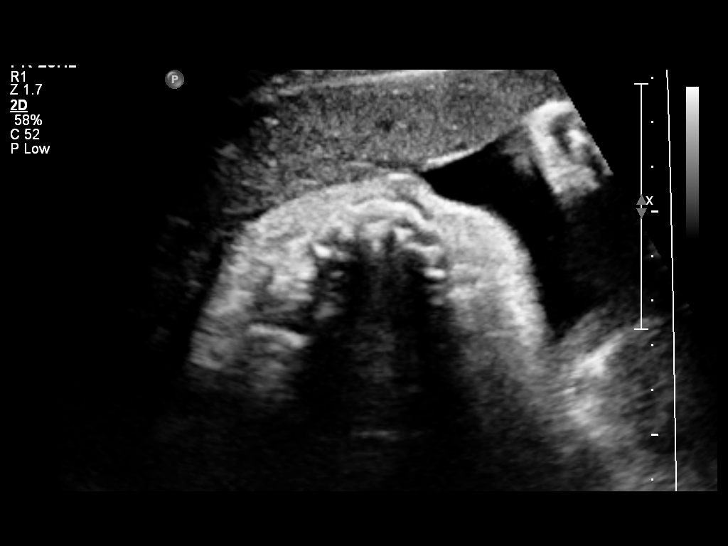

[12 of 28 positions shown; findings below may reference images not displayed]

OBSTETRICS REPORT
                      (Signed Final 03/27/2013 [DATE])

Service(s) Provided

 US OB COMP + 14 WK                                    76805.1
Indications

 Premature rupture of membranes - leaking fluid
Fetal Evaluation

 Num Of Fetuses:    1
 Fetal Heart Rate:  150                          bpm
 Cardiac Activity:  Observed
 Presentation:      Cephalic
 Placenta:          Anterior, above cervical os
 P. Cord            Visualized, central
 Insertion:

 Amniotic Fluid
 AFI FV:      Subjectively within normal limits
 AFI Sum:     16.48   cm       60  %Tile     Larg Pckt:    6.12  cm
 RUQ:   6.12    cm   RLQ:    3.75   cm    LUQ:   4.63    cm   LLQ:    1.98   cm
Biometry

 BPD:     77.1  mm     G. Age:  31w 0d                CI:        73.43   70 - 86
                                                      FL/HC:      21.6   19.1 -

 HC:     285.9  mm     G. Age:  31w 3d       10  %    HC/AC:      1.01   0.96 -

 AC:     284.4  mm     G. Age:  32w 3d       70  %    FL/BPD:     80.2   71 - 87
 FL:      61.8  mm     G. Age:  32w 0d       46  %    FL/AC:      21.7   20 - 24
 HUM:     53.7  mm     G. Age:  31w 2d       42  %

 Est. FW:    7757  gm      4 lb 3 oz     63  %
Gestational Age

 LMP:           31w 5d        Date:  08/17/12                 EDD:   05/24/13
 U/S Today:     31w 5d                                        EDD:   05/24/13
 Best:          31w 5d     Det. By:  LMP  (08/17/12)          EDD:   05/24/13
Anatomy

 Cranium:          Appears normal         Aortic Arch:      Not well visualized
 Fetal Cavum:      Appears normal         Ductal Arch:      Appears normal
 Ventricles:       Appears normal         Diaphragm:        Appears normal
 Choroid Plexus:   Appears normal         Stomach:          Appears normal, left
                                                            sided
 Cerebellum:       Appears normal         Abdomen:          Appears normal
 Posterior Fossa:  Appears normal         Abdominal Wall:   Not well visualized
 Nuchal Fold:      Not applicable (>20    Cord Vessels:     Appears normal (3
                   wks GA)                                  vessel cord)
 Face:             Appears normal         Kidneys:          Appear normal
                   (orbits and profile)
 Lips:             Appears normal         Bladder:          Appears normal
 Palate:           Appears normal         Spine:            Not well visualized
 Heart:            Appears normal         Lower             Visualized
                   (4CH, axis, and        Extremities:
                   situs)
 RVOT:             Appears normal         Upper             Visualized
                                          Extremities:
 LVOT:             Appears normal

 Other:  Fetus appears to be a male. Technically difficult due to advanced GA
         and fetal position.
Cervix Uterus Adnexa

 Cervix:       Not visualized (advanced GA >55wks)
 Uterus:       No abnormality visualized.
 Left Ovary:    Not visualized.
 Right Ovary:   Not visualized.

 Adnexa:     No abnormality visualized.
Impression

 Active SIUP at 65w4d on attempted comprehensive
 evaluation of fetal anatomy only
 EFW is at the 63rd percentile
 AFI is gestational age appropriate
 No previa
 No apparent dysmorphic features with limitations as
 documented above
Recommendations

 Management as per OB provider.  Follow up ultrasounds as
 clinically indicated.

## 2014-12-20 ENCOUNTER — Emergency Department (HOSPITAL_COMMUNITY)
Admission: EM | Admit: 2014-12-20 | Discharge: 2014-12-20 | Disposition: A | Discharge status: Home / Self Care | Payer: Federal, State, Local not specified - PPO | Attending: Emergency Medicine | Admitting: Emergency Medicine

## 2014-12-20 ENCOUNTER — Encounter (HOSPITAL_COMMUNITY): Payer: Self-pay | Admitting: Emergency Medicine

## 2014-12-20 DIAGNOSIS — Z87891 Personal history of nicotine dependence: Secondary | ICD-10-CM | POA: Diagnosis not present

## 2014-12-20 DIAGNOSIS — Z8751 Personal history of pre-term labor: Secondary | ICD-10-CM | POA: Diagnosis not present

## 2014-12-20 DIAGNOSIS — R109 Unspecified abdominal pain: Secondary | ICD-10-CM | POA: Insufficient documentation

## 2014-12-20 DIAGNOSIS — Z8619 Personal history of other infectious and parasitic diseases: Secondary | ICD-10-CM | POA: Insufficient documentation

## 2014-12-20 DIAGNOSIS — Z880 Allergy status to penicillin: Secondary | ICD-10-CM | POA: Diagnosis not present

## 2014-12-20 DIAGNOSIS — R112 Nausea with vomiting, unspecified: Secondary | ICD-10-CM | POA: Insufficient documentation

## 2014-12-20 LAB — URINE MICROSCOPIC-ADD ON

## 2014-12-20 LAB — CBC
HEMATOCRIT: 39.8 % (ref 36.0–46.0)
Hemoglobin: 13 g/dL (ref 12.0–15.0)
MCH: 29.1 pg (ref 26.0–34.0)
MCHC: 32.7 g/dL (ref 30.0–36.0)
MCV: 89.2 fL (ref 78.0–100.0)
PLATELETS: 318 10*3/uL (ref 150–400)
RBC: 4.46 MIL/uL (ref 3.87–5.11)
RDW: 13.3 % (ref 11.5–15.5)
WBC: 12.5 10*3/uL — ABNORMAL HIGH (ref 4.0–10.5)

## 2014-12-20 LAB — URINALYSIS, ROUTINE W REFLEX MICROSCOPIC
Bilirubin Urine: NEGATIVE
Glucose, UA: NEGATIVE mg/dL
KETONES UR: NEGATIVE mg/dL
Nitrite: POSITIVE — AB
PH: 8 (ref 5.0–8.0)
Protein, ur: 30 mg/dL — AB
Specific Gravity, Urine: 1.027 (ref 1.005–1.030)
Urobilinogen, UA: 1 mg/dL (ref 0.0–1.0)

## 2014-12-20 LAB — COMPREHENSIVE METABOLIC PANEL
ALT: 23 U/L (ref 14–54)
AST: 22 U/L (ref 15–41)
Albumin: 4.1 g/dL (ref 3.5–5.0)
Alkaline Phosphatase: 60 U/L (ref 38–126)
Anion gap: 9 (ref 5–15)
BILIRUBIN TOTAL: 0.7 mg/dL (ref 0.3–1.2)
BUN: 11 mg/dL (ref 6–20)
CO2: 24 mmol/L (ref 22–32)
Calcium: 9.3 mg/dL (ref 8.9–10.3)
Chloride: 106 mmol/L (ref 101–111)
Creatinine, Ser: 0.89 mg/dL (ref 0.44–1.00)
GFR calc non Af Amer: >60 mL/min (ref >60)
Glucose, Bld: 132 mg/dL — ABNORMAL HIGH (ref 65–99)
POTASSIUM: 3.9 mmol/L (ref 3.5–5.1)
SODIUM: 139 mmol/L (ref 135–145)
Total Protein: 8.2 g/dL — ABNORMAL HIGH (ref 6.5–8.1)

## 2014-12-20 LAB — I-STAT BETA HCG BLOOD, ED (MC, WL, AP ONLY): I-stat hCG, quantitative: <5 m[IU]/mL (ref <5)

## 2014-12-20 LAB — LIPASE, BLOOD: Lipase: 16 U/L — ABNORMAL LOW (ref 22–51)

## 2014-12-20 MED ORDER — ONDANSETRON HCL 4 MG/2ML IJ SOLN
4.0000 mg | Freq: Once | INTRAMUSCULAR | Status: AC
  Administered 2014-12-20: 4 mg via INTRAVENOUS
  Filled 2014-12-20: qty 2

## 2014-12-20 MED ORDER — PROMETHAZINE HCL 25 MG/ML IJ SOLN
25.0000 mg | Freq: Once | INTRAMUSCULAR | Status: AC
  Administered 2014-12-20: 25 mg via INTRAVENOUS
  Filled 2014-12-20: qty 1

## 2014-12-20 MED ORDER — ONDANSETRON HCL 4 MG/2ML IJ SOLN
4.0000 mg | Freq: Once | INTRAMUSCULAR | Status: AC | PRN
  Administered 2014-12-20: 4 mg via INTRAVENOUS
  Filled 2014-12-20: qty 2

## 2014-12-20 MED ORDER — PROMETHAZINE HCL 25 MG PO TABS: 25.0000 mg | ORAL_TABLET | Freq: Four times a day (QID) | ORAL | Status: DC | PRN

## 2014-12-20 MED ORDER — SODIUM CHLORIDE 0.9 % IV BOLUS (SEPSIS)
1000.0000 mL | Freq: Once | INTRAVENOUS | Status: AC
  Administered 2014-12-20: 1000 mL via INTRAVENOUS

## 2014-12-20 NOTE — ED Provider Notes (Signed)
6:00 AM  Patient signed out to me by Sharen Hones, NP.  Patient presents today with nausea and vomiting.  Child with recent similar symptoms.  Plan is to rehydrate the patient ad reassess.    7:30 AM Reassessed patient.  She is sleeping in bed.  Instructed patient to drink fluids.  Will fluid challenge.  8:15 AM  Reassessed patient.  She reports that she is unable to tolerate PO liquids.  Patient vomited after attempting fluid challenge.  Will order a dose of Phenergan and reassess.    10:00 AM Reassessed patient.  She is tolerating PO liquids.  Abdomen is soft and non tender.   Stable for discharge.  Return precautions given.   Santiago Glad, PA-C 12/21/14 2121  Devoria Albe, MD 12/21/14 2259

## 2014-12-20 NOTE — ED Notes (Signed)
Bed: WA05 Expected date:  Expected time:  Means of arrival:  Comments: EMS 

## 2014-12-20 NOTE — ED Notes (Signed)
Patient given ginger ale. 

## 2014-12-20 NOTE — ED Notes (Signed)
NP at bedside.

## 2014-12-20 NOTE — ED Notes (Signed)
Pt brought in by [facility] c/o generalized abdominal pain and vomiting since 2300 on 7/28. She reports 20+ episodes of vomiting since symptoms started. LMP started yesterday.

## 2014-12-20 NOTE — ED Provider Notes (Signed)
CSN: [id number]     Arrival date & time 12/20/14  0408 History   First MD Initiated Contact with Patient 12/20/14 0435     Chief Complaint  Patient presents with  . Emesis     (Consider location/radiation/quality/duration/timing/severity/associated sxs/prior Treatment) HPI Comments: This is a normally healthy 24 year old female who was awakened at 11:00 last evening with crampy abdominal pain nausea and vomiting she's had multiple episodes of vomiting since that time she denies any fever or chills, diarrhea or constipation. Patient states that she was with her son in the pediatric ED 2 nights ago with a number of children with vomiting  Patient is a 24 y.o. female presenting with vomiting. The history is provided by the patient.  Emesis Severity:  Moderate Duration:  6 hours Timing:  Intermittent Number of daily episodes:  20 Quality:  Bilious material Progression:  Unchanged Chronicity:  New Recent urination:  Normal Relieved by:  Nothing Worsened by:  Nothing tried Ineffective treatments:  None tried Associated symptoms: abdominal pain   Associated symptoms: no chills, no diarrhea, no fever, no myalgias and no sore throat   Abdominal pain:    Location:  Generalized   Quality:  Cramping   Severity:  Moderate   Onset quality:  Sudden   Timing:  Intermittent   Progression:  Unchanged   Chronicity:  New   Past Medical History  Diagnosis Date  . Trichomonas   . Gonorrhea   . Chlamydia   . Preterm labor    Past Surgical History  Procedure Laterality Date  . Therapeutic abortion     Family History  Problem Relation Age of Onset  . Anesthesia problems Neg Hx   . Heart disease Father   . Cancer Maternal Grandfather     prostate  . Kidney disease Paternal Grandmother   . Diabetes Maternal Grandmother   . Kidney disease Maternal Grandmother   . Cancer Maternal Grandmother   . Cancer Maternal Uncle    History  Substance Use Topics  . Smoking status: Former Smoker  -- 0.01 packs/day for 1 years    Types: Cigarettes    Quit date: 09/06/2012  . Smokeless tobacco: Never Used     Comment: quit with pos preg test  . Alcohol Use: No     Comment: weekends   OB History    Gravida Para Term Preterm AB TAB SAB Ectopic Multiple Living   4 2 1 1 1 1  0  0 2     Review of Systems  Constitutional: Negative for fever and chills.  HENT: Negative for sore throat.   Respiratory: Negative for shortness of breath.   Cardiovascular: Negative for chest pain.  Gastrointestinal: Positive for nausea, vomiting and abdominal pain. Negative for diarrhea and constipation.  Genitourinary: Negative for dysuria.  Musculoskeletal: Negative for myalgias.  Skin: Negative for rash.  All other systems reviewed and are negative.     Allergies  Penicillins  Home Medications   Prior to Admission medications   Medication Sig Start Date End Date Taking? Authorizing Provider  alum & mag hydroxide-simeth (COMFORT GEL) 200-200-20 MG/5ML suspension Take 30 mLs by mouth every 6 (six) hours as needed for indigestion or heartburn.   Yes Historical Provider, MD  famotidine (PEPCID) 20 MG tablet Take 1 tablet (20 mg total) by mouth 2 (two) times daily. Patient not taking: Reported on 12/20/2014 09/01/14   Renne Crigler, PA-C  ondansetron (ZOFRAN ODT) 4 MG disintegrating tablet Take 1 tablet (4 mg total) by mouth  every 8 (eight) hours as needed for nausea or vomiting. Patient not taking: Reported on 12/20/2014 09/01/14   Renne Crigler, PA-C  promethazine (PHENERGAN) 25 MG tablet Take 1 tablet (25 mg total) by mouth every 6 (six) hours as needed for nausea. 12/20/14   Heather Laisure, PA-C   BP 102/59 mmHg  Pulse 82  Temp(Src) 99.1 F (37.3 C) (Oral)  Resp 18  SpO2 98%  LMP 12/20/2014 Physical Exam  Constitutional: She is oriented to person, place, and time. She appears well-developed and well-nourished.  HENT:  Head: Normocephalic.  Eyes: Pupils are equal, round, and reactive to  light.  Neck: Normal range of motion.  Cardiovascular: Normal rate and regular rhythm.   Pulmonary/Chest: Effort normal and breath sounds normal.  Abdominal: Soft. She exhibits no distension. There is no tenderness.  Neurological: She is alert and oriented to person, place, and time.  Skin: Skin is warm.  Nursing note and vitals reviewed.   ED Course  Procedures (including critical care time) Labs Review Labs Reviewed  LIPASE, BLOOD - Abnormal; Notable for the following:    Lipase 16 (*)    All other components within normal limits  COMPREHENSIVE METABOLIC PANEL - Abnormal; Notable for the following:    Glucose, Bld 132 (*)    Total Protein 8.2 (*)    All other components within normal limits  CBC - Abnormal; Notable for the following:    WBC 12.5 (*)    All other components within normal limits  URINALYSIS, ROUTINE W REFLEX MICROSCOPIC (NOT AT [facility]) - Abnormal; Notable for the following:    Color, Urine [place] (*)    APPearance CLOUDY (*)    Hgb urine dipstick LARGE (*)    Protein, ur 30 (*)    Nitrite POSITIVE (*)    Leukocytes, UA SMALL (*)    All other components within normal limits  URINE MICROSCOPIC-ADD ON - Abnormal; Notable for the following:    Bacteria, UA MANY (*)    All other components within normal limits  URINE CULTURE  I-STAT BETA HCG BLOOD, ED ([facility], [facility], [facility] ONLY)    Imaging Review No results found.   EKG Interpretation None      MDM   Final diagnoses:  Non-intractable vomiting with nausea, vomiting of unspecified type         Earley Favor, NP 12/21/14 1610  Devoria Albe, MD 12/31/14 9604

## 2014-12-20 NOTE — ED Notes (Signed)
Patient given ginger ale and water 

## 2014-12-20 NOTE — ED Notes (Addendum)
Patient states that she was unable to keep fluids down and had 1 episode of diarrhea.

## 2014-12-20 NOTE — ED Notes (Signed)
Pt aware of need for urine sample. Pt unable to void at this time.  

## 2014-12-24 LAB — URINE CULTURE: Culture: >=100000

## 2014-12-25 ENCOUNTER — Telehealth (HOSPITAL_COMMUNITY): Payer: Self-pay

## 2014-12-25 NOTE — Telephone Encounter (Signed)
Post ED Visit - Positive Culture Follow-up: Successful Patient Follow-Up  Culture assessed and recommendations reviewed by:  Celedonio Miyamoto, Pharm.D., BCPS-AQ ID  Georgina Pillion, Pharm.D., BCPS  [place], [address].D., BCPS, AAHIVP  Estella Husk, Pharm.D., BCPS, AAHIVP  Tegan Magsam, Pharm.D.  Cassie Roseanne [place], [place].D.  Positive Urine culture, >/= 100,000 colonies -> E Coli   Patient discharged without antimicrobial prescription and treatment is now indicated  Organism is resistant to prescribed ED discharge antimicrobial  Patient with positive blood cultures  Changes discussed with ED provider: K. Goehner PA New antibiotic prescription "Cephalexin 500 mg, 1 tablet by mouth every 12 hours for 7 days"  Contacted patient, date 12/25/14, time 13:23 LVM requesting callback   Arvid Right 12/25/2014, 1:29 PM

## 2014-12-25 NOTE — Progress Notes (Signed)
ED Antimicrobial Stewardship Positive Culture Follow Up   Lauren Diaz is an 24 y.o. female who presented to [facility] on 12/20/2014 with a chief complaint of  Chief Complaint  Patient presents with  . Emesis    Recent Results (from the past 720 hour(s))  Urine culture     Status: None   Collection Time: 12/20/14  6:59 AM  Result Value Ref Range Status   Specimen Description URINE, CLEAN CATCH  Final   Special Requests NONE  Final   Culture   Final    >=100,000 COLONIES/mL ESCHERICHIA COLI Performed at [facility]    Report Status 12/24/2014 FINAL  Final   Organism ID, Bacteria ESCHERICHIA COLI  Final      Susceptibility   Escherichia coli - MIC*    AMPICILLIN >=32 RESISTANT Resistant     CEFAZOLIN <=4 SENSITIVE Sensitive     CEFTRIAXONE <=1 SENSITIVE Sensitive     CIPROFLOXACIN 0.5 SENSITIVE Sensitive     GENTAMICIN <=1 SENSITIVE Sensitive     IMIPENEM <=0.25 SENSITIVE Sensitive     NITROFURANTOIN <=16 SENSITIVE Sensitive     TRIMETH/SULFA >=320 RESISTANT Resistant     AMPICILLIN/SULBACTAM 16 INTERMEDIATE Intermediate     PIP/TAZO <=4 SENSITIVE Sensitive     * >=100,000 COLONIES/mL ESCHERICHIA COLI    Patient discharged originally without antimicrobial agent and treatment is now indicated  New antibiotic prescription: Cephalexin  1 tablet by mouth every 12 hours for 7 days  ED Provider: Cheron Schaumann, PA-C   Lauren Diaz Lauren Diaz 12/25/2014, 8:17 AM Infectious Diseases Pharmacist Phone# [phone]

## 2014-12-26 ENCOUNTER — Telehealth (HOSPITAL_COMMUNITY): Payer: Self-pay

## 2014-12-28 ENCOUNTER — Telehealth (HOSPITAL_COMMUNITY): Payer: Self-pay | Admitting: Emergency Medicine

## 2014-12-29 ENCOUNTER — Telehealth (HOSPITAL_COMMUNITY): Payer: Self-pay | Admitting: Emergency Medicine

## 2014-12-29 NOTE — Telephone Encounter (Signed)
Post ED Visit - Positive Culture Follow-up: Unsuccessful Patient Follow-up  Positive Urine culture  [x]  Patient discharged without antimicrobial prescription and treatment is now indicated  Organism is resistant to prescribed ED discharge antimicrobial  Patient with positive blood cultures   Unable to contact patie t after 3 attempts, letter will be sent to address on file  Jiles Harold 12/29/2014, 6:13 PM

## 2015-01-13 ENCOUNTER — Telehealth (HOSPITAL_COMMUNITY): Payer: Self-pay

## 2015-01-13 NOTE — Telephone Encounter (Signed)
Unable to contact pt by mail or telephone. Unable to communicate lab results or treatment changes. 

## 2015-01-15 ENCOUNTER — Telehealth (HOSPITAL_BASED_OUTPATIENT_CLINIC_OR_DEPARTMENT_OTHER): Payer: Self-pay | Admitting: Emergency Medicine

## 2015-01-15 NOTE — Telephone Encounter (Signed)
Letter sent to patient returned, no forwarding address known, lost to followup

## 2016-03-01 ENCOUNTER — Encounter (HOSPITAL_COMMUNITY): Payer: Self-pay

## 2016-03-01 ENCOUNTER — Emergency Department (HOSPITAL_COMMUNITY)
Admission: EM | Admit: 2016-03-01 | Discharge: 2016-03-01 | Disposition: A | Discharge status: Home / Self Care | Payer: Federal, State, Local not specified - PPO | Attending: Physician Assistant | Admitting: Physician Assistant

## 2016-03-01 DIAGNOSIS — H1032 Unspecified acute conjunctivitis, left eye: Secondary | ICD-10-CM | POA: Diagnosis not present

## 2016-03-01 DIAGNOSIS — H109 Unspecified conjunctivitis: Secondary | ICD-10-CM | POA: Diagnosis present

## 2016-03-01 DIAGNOSIS — Z87891 Personal history of nicotine dependence: Secondary | ICD-10-CM | POA: Insufficient documentation

## 2016-03-01 MED ORDER — POLYMYXIN B-TRIMETHOPRIM 10000-0.1 UNIT/ML-% OP SOLN: 1.0000 [drp] | OPHTHALMIC | 0 refills | Status: AC

## 2016-03-01 NOTE — ED Triage Notes (Signed)
patient complains of left eye redness and drainage without pain x 1 day, children have pink eye

## 2016-03-01 NOTE — Discharge Instructions (Signed)
Conjunctivitis is commonly called "pink eye." Conjunctivitis can be caused by bacterial or viral infection, allergies, or injuries. There is usually redness of the lining of the eye, itching, discomfort, and sometimes discharge. Pink eye is very contagious and spreads by direct contact. Try to avoid rubbing eyes and wash hands often. ° °You may be given antibiotic eyedrops as part of your treatment. Before using your eye medicine, remove all drainage from the eye by washing gently with warm water and cotton balls. Continue to use the medication until you have awakened 2 mornings in a row without discharge from the eye. Do not rub your eye. This increases the irritation and helps spread infection. Use separate towels from other household members. Wash your hands with soap and water before and after touching your eyes. Use cold compresses to reduce pain and sunglasses to relieve irritation from light. Do not wear contact lenses or wear eye makeup until the infection is gone.  ° °SEEK MEDICAL CARE IF:  °Your symptoms are not better after 3 days of treatment.  °You have increased pain or trouble seeing.  °The outer eyelids become very red or swollen.  °You develop double vision or your vision becomes blurred or worsens in any way.  °You have trouble moving your eyes.  °You develop a severe headache, severe neck pain, or neck stiffness.  °You develop repeated vomiting.  °You have a fever or persistent symptoms for more than 72 hours.  °You have a fever and your symptoms suddenly get worse.  °

## 2016-03-01 NOTE — ED Provider Notes (Signed)
[facility]-EM[facility]GENCY DEPT Provider Note   CSN: [id number] Arrival date & time: 03/01/16  1214  By signing my name below, I, Bridgette Habermann, attest that this documentation has been prepared under the direction and in the presence of [facility], PA-C. Electronically Signed: Bridgette Habermann, ED Scribe. 03/01/16. 1:33 PM.  History   Chief Complaint Chief Complaint  Patient presents with  . Conjunctivitis   HPI Comments: Lauren Diaz is a 25 y.o. female who presents to the Emergency Department complaining of left eye redness with drainage onset one day ago. Denies any recent injury or trauma to the area. She notes that her son has conjunctivitis, she has tried his prescribed eye drops one time with no relief. She denies any pain. Pt denies fever, cough, congestion, vision change, or any other associated symptoms.   The history is provided by the patient. No language interpreter was used.    Past Medical History:  Diagnosis Date  . Chlamydia   . Gonorrhea   . Preterm labor   . Trichomonas     Patient Active Problem List   Diagnosis Date Noted  . SVD (spontaneous vaginal delivery) 05/08/2014  . Post-dates pregnancy 05/07/2014  . Short cervical length during pregnancy 02/16/2014  . Preterm labor 02/15/2014    Past Surgical History:  Procedure Laterality Date  . THERAPEUTIC ABORTION      OB History    Gravida Para Term Preterm AB Living   4 2 1 1 1 2    SAB TAB Ectopic Multiple Live Births   0 1   0 2       Home Medications    Prior to Admission medications   Medication Sig Start Date End Date Taking? Authorizing Provider  alum & mag hydroxide-simeth (COMFORT GEL) 200-200-20 MG/5ML suspension Take 30 mLs by mouth every 6 (six) hours as needed for indigestion or heartburn.    Historical Provider, MD  famotidine (PEPCID) 20 MG tablet Take 1 tablet (20 mg total) by mouth 2 (two) times daily. Patient not taking: Reported on 12/20/2014 09/01/14   Renne Crigler, PA-C  ondansetron (ZOFRAN  ODT) 4 MG disintegrating tablet Take 1 tablet (4 mg total) by mouth every 8 (eight) hours as needed for nausea or vomiting. Patient not taking: Reported on 12/20/2014 09/01/14   Renne Crigler, PA-C  promethazine (PHENERGAN) 25 MG tablet Take 1 tablet (25 mg total) by mouth every 6 (six) hours as needed for nausea. 12/20/14   Santiago Glad, PA-C  trimethoprim-polymyxin b (POLYTRIM) ophthalmic solution Place 1 drop into the left eye every 4 (four) hours. 03/01/16 03/08/16  Chase Picket Takai Chiaramonte, PA-C    Family History Family History  Problem Relation Age of Onset  . Heart disease Father   . Cancer Maternal Grandfather     prostate  . Kidney disease Paternal Grandmother   . Diabetes Maternal Grandmother   . Kidney disease Maternal Grandmother   . Cancer Maternal Grandmother   . Cancer Maternal Uncle   . Anesthesia problems Neg Hx     Social History Social History  Substance Use Topics  . Smoking status: Former Smoker    Packs/day: 0.01    Years: 1.00    Types: Cigarettes    Quit date: 09/06/2012  . Smokeless tobacco: Never Used     Comment: quit with pos preg test  . Alcohol use No     Comment: weekends     Allergies   Penicillins   Review of Systems Review of Systems  Constitutional: Negative  for fever.  HENT: Negative for congestion.   Eyes: Positive for discharge and redness. Negative for pain and visual disturbance.  Respiratory: Negative for cough.      Physical Exam Updated Vital Signs BP 123/67 (BP Location: Right Arm)   Pulse 83   Temp 98.3 F (36.8 C) (Oral)   Resp 17   Ht 5\' 6"  (1.676 m)   Wt 90.7 kg   SpO2 96%   BMI 32.28 kg/m   Physical Exam  Constitutional: She is oriented to person, place, and time. She appears well-developed and well-nourished. No distress.  HENT:  Head: Normocephalic and atraumatic.  Eyes: EOM and lids are normal. Pupils are equal, round, and reactive to light. Left eye exhibits discharge. Right conjunctiva is not injected. Left  conjunctiva is injected.  Cardiovascular: Normal rate, regular rhythm, normal heart sounds and intact distal pulses.  Exam reveals no gallop and no friction rub.   No murmur heard. Pulmonary/Chest: Effort normal and breath sounds normal. No respiratory distress. She has no wheezes. She has no rales. She exhibits no tenderness.  Abdominal: Soft. Bowel sounds are normal. She exhibits no distension. There is no tenderness.  Musculoskeletal: She exhibits no edema.  Neurological: She is alert and oriented to person, place, and time.  Skin: Skin is warm and dry.  Nursing note and vitals reviewed.    ED Treatments / Results  DIAGNOSTIC STUDIES: Oxygen Saturation is 96% on RA, adequate by my interpretation.    COORDINATION OF CARE: 1:30 PM Discussed treatment plan with pt at bedside which includes antibiotic eyedrops and pt agreed to plan.  Labs (all labs ordered are listed, but only abnormal results are displayed) Labs Reviewed - No data to display  EKG  EKG Interpretation None       Radiology No results found.  Procedures Procedures (including critical care time)  Medications Ordered in ED Medications - No data to display   Initial Impression / Assessment and Plan / ED Course  I have reviewed the triage vital signs and the nursing notes.  Pertinent labs & imaging results that were available during my care of the patient were reviewed by me and considered in my medical decision making (see chart for details).  Clinical Course   Patient presentation consistent with conjunctivitis. Son at home with same. Pt discharged with antibiotics. Personal hygiene and frequent handwashing discussed. Patient advised to follow up with ophthalmologist or PCP if symptoms persist or worsen. Return precautions discussed.  Patient verbalizes understanding and is agreeable with discharge.  Final Clinical Impressions(s) / ED Diagnoses   Final diagnoses:  Acute bacterial conjunctivitis of left  eye   I personally performed the services described in this documentation, which was scribed in my presence. The recorded information has been reviewed and is accurate.   New Prescriptions Discharge Medication List as of 03/01/2016  1:34 PM    START taking these medications   Details  trimethoprim-polymyxin b (POLYTRIM) ophthalmic solution Place 1 drop into the left eye every 4 (four) hours., Starting Mon 03/01/2016, Until Mon 03/08/2016, Print         [facility] GroupJaime Pilcher Lash Matulich, PA-C 03/01/16 1548    Courteney Randall AnLyn Mackuen, MD 03/02/16 1017

## 2016-03-01 NOTE — ED Notes (Signed)
Declined W/C at D/C and was escorted to lobby by RN. 

## 2016-03-04 ENCOUNTER — Emergency Department (HOSPITAL_COMMUNITY)
Admission: EM | Admit: 2016-03-04 | Discharge: 2016-03-04 | Disposition: A | Discharge status: Home / Self Care | Payer: Federal, State, Local not specified - PPO | Attending: Emergency Medicine | Admitting: Emergency Medicine

## 2016-03-04 ENCOUNTER — Encounter (HOSPITAL_COMMUNITY): Payer: Self-pay | Admitting: Emergency Medicine

## 2016-03-04 DIAGNOSIS — H1032 Unspecified acute conjunctivitis, left eye: Secondary | ICD-10-CM

## 2016-03-04 DIAGNOSIS — Z87891 Personal history of nicotine dependence: Secondary | ICD-10-CM | POA: Diagnosis not present

## 2016-03-04 DIAGNOSIS — H02846 Edema of left eye, unspecified eyelid: Secondary | ICD-10-CM | POA: Diagnosis present

## 2016-03-04 DIAGNOSIS — H109 Unspecified conjunctivitis: Secondary | ICD-10-CM | POA: Insufficient documentation

## 2016-03-04 DIAGNOSIS — J069 Acute upper respiratory infection, unspecified: Secondary | ICD-10-CM | POA: Diagnosis not present

## 2016-03-04 LAB — RAPID STREP SCREEN (MED CTR MEBANE ONLY): Streptococcus, Group A Screen (Direct): NEGATIVE

## 2016-03-04 MED ORDER — CEPHALEXIN 250 MG/5ML PO SUSR: 500.0000 mg | Freq: Two times a day (BID) | ORAL | 0 refills | Status: AC

## 2016-03-04 MED ORDER — SULFAMETHOXAZOLE-TRIMETHOPRIM 800-160 MG PO TABS: 1.0000 | ORAL_TABLET | Freq: Every day | ORAL | 0 refills | Status: DC

## 2016-03-04 NOTE — Discharge Instructions (Signed)
Use warm compresses over eye Take antibiotics for one week Make appointment with eye doctor if symptoms are not getting better

## 2016-03-04 NOTE — ED Provider Notes (Signed)
[facility]-EMERGENCY DEPT Provider Note   CSN: [id number] Arrival date & time: 03/04/16  1015  By signing my name below, I, Teofilo PodMatthew P. Jamison, attest that this documentation has been prepared under the direction and in the presence of Terance HartKelly Nikyah Lackman, PA-C. Electronically Signed: Teofilo PodMatthew P. Jamison, ED Scribe. 03/04/2016. 11:00 AM.   History   Chief Complaint Chief Complaint  Patient presents with  . Eye Problem    The history is provided by the patient. No language interpreter was used.   HPI Comments:  Lauren Diaz is a 25 y.o. female who presents to the Emergency Department complaining of worsening left eye swelling x 3 days. [facility] for the same symptoms and was diagnosed with conjunctivitis and states that the her L eye has become more swollen. Pt reports associated eye redness, eye itching, eye drainage, decreased vision, congestion, sore throat and left-sided facial lymphadenopathy. Denies eye pain but has discomfort when she wipes her eye. Pt reports sick contact with her son and son's father who both recently had conjunctivitis. Pt was treated with antibiotic eyedrops 3 days ago that have not provided any relief. Pt denies fever, headache, SOB. Has not seen opthalmology.  Past Medical History:  Diagnosis Date  . Chlamydia   . Gonorrhea   . Preterm labor   . Trichomonas     Patient Active Problem List   Diagnosis Date Noted  . SVD (spontaneous vaginal delivery) 05/08/2014  . Post-dates pregnancy 05/07/2014  . Short cervical length during pregnancy 02/16/2014  . Preterm labor 02/15/2014    Past Surgical History:  Procedure Laterality Date  . THERAPEUTIC ABORTION      OB History    Gravida Para Term Preterm AB Living   4 2 1 1 1 2    SAB TAB Ectopic Multiple Live Births   0 1   0 2       Home Medications    Prior to Admission medications   Medication Sig Start Date End Date Taking? Authorizing Provider  alum & mag hydroxide-simeth  (COMFORT GEL) 200-200-20 MG/5ML suspension Take 30 mLs by mouth every 6 (six) hours as needed for indigestion or heartburn.    Historical Provider, MD  famotidine (PEPCID) 20 MG tablet Take 1 tablet (20 mg total) by mouth 2 (two) times daily. Patient not taking: Reported on 12/20/2014 09/01/14   Renne CriglerJoshua Geiple, PA-C  ondansetron (ZOFRAN ODT) 4 MG disintegrating tablet Take 1 tablet (4 mg total) by mouth every 8 (eight) hours as needed for nausea or vomiting. Patient not taking: Reported on 12/20/2014 09/01/14   Renne CriglerJoshua Geiple, PA-C  promethazine (PHENERGAN) 25 MG tablet Take 1 tablet (25 mg total) by mouth every 6 (six) hours as needed for nausea. 12/20/14   Santiago GladHeather Laisure, PA-C  trimethoprim-polymyxin b (POLYTRIM) ophthalmic solution Place 1 drop into the left eye every 4 (four) hours. 03/01/16 03/08/16  Chase PicketJaime Pilcher Ward, PA-C    Family History Family History  Problem Relation Age of Onset  . Heart disease Father   . Cancer Maternal Grandfather     prostate  . Kidney disease Paternal Grandmother   . Diabetes Maternal Grandmother   . Kidney disease Maternal Grandmother   . Cancer Maternal Grandmother   . Cancer Maternal Uncle   . Anesthesia problems Neg Hx     Social History Social History  Substance Use Topics  . Smoking status: Former Smoker    Packs/day: 0.01    Years: 1.00  Types: Cigarettes    Quit date: 09/06/2012  . Smokeless tobacco: Never Used     Comment: quit with pos preg test  . Alcohol use No     Comment: weekends     Allergies   Penicillins   Review of Systems Review of Systems  Constitutional: Negative for fever.  HENT: Positive for congestion and sore throat.   Eyes: Positive for pain, discharge, redness, itching and visual disturbance.  Neurological: Negative for headaches.  Hematological: Positive for adenopathy.     Physical Exam Updated Vital Signs BP 117/78 (BP Location: Left Arm)   Pulse 88   Temp 99.5 F (37.5 C) (Oral)   Resp 18   LMP  03/04/2016   SpO2 98%   Physical Exam  Constitutional: She appears well-developed and well-nourished. No distress.  HENT:  Head: Normocephalic and atraumatic.  Right Ear: Hearing, tympanic membrane, external ear and ear canal normal.  Left Ear: Hearing, tympanic membrane, external ear and ear canal normal.  Nose: Mucosal edema and rhinorrhea present.  Mouth/Throat: Uvula is midline. Oropharyngeal exudate and posterior oropharyngeal erythema present. No posterior oropharyngeal edema or tonsillar abscesses.  Preauricular lymphadenopathy on L side  Eyes: EOM are normal. Pupils are equal, round, and reactive to light. Right eye exhibits chemosis and discharge. Right eye exhibits no exudate and no hordeolum. No foreign body present in the right eye. Left eye exhibits no chemosis, no discharge, no exudate and no hordeolum. No foreign body present in the left eye. Right conjunctiva is injected. Right conjunctiva has no hemorrhage. Left conjunctiva is not injected. Left conjunctiva has no hemorrhage. No scleral icterus.  No pain with EOMs  Cardiovascular: Normal rate.   Pulmonary/Chest: Effort normal.  Abdominal: She exhibits no distension.  Neurological: She is alert.  Skin: Skin is warm and dry.  Psychiatric: She has a normal mood and affect.  Nursing note and vitals reviewed.    ED Treatments / Results  DIAGNOSTIC STUDIES:  Oxygen Saturation is 98% on RA, normal by my interpretation.    COORDINATION OF CARE:  11:00 AM Will prescribe oral antibiotics and recommended OTC decongestant. Discussed treatment plan with pt at bedside and pt agreed to plan.   Labs (all labs ordered are listed, but only abnormal results are displayed) Labs Reviewed - No data to display  EKG  EKG Interpretation None       Radiology No results found.  Procedures Procedures (including critical care time)  Medications Ordered in ED Medications - No data to display   Initial Impression / Assessment  and Plan / ED Course  I have reviewed the triage vital signs and the nursing notes.  Pertinent labs & imaging results that were available during my care of the patient were reviewed by me and considered in my medical decision making (see chart for details).  Clinical Course   25 year old female presents with worsening eye redness, discharge, edema. Patient is afebrile, not tachycardic or tachypneic, normotensive, and not hypoxic. Some concern for preseptal cellulitis. She has URI symptoms now with lymphadenopathy with significant eye swelling. Low concern for orbital cellulitis as she is afebrile without painful EOM. Will rx course of Keflex and Bactrim. Advised follow up with Opthalmology if symptoms are not improving or return to the ED. Patient is NAD, non-toxic, with stable VS. Patient is informed of clinical course, understands medical decision making process, and agrees with plan. Opportunity for questions provided and all questions answered.    Final Clinical Impressions(s) / ED  Diagnoses   Final diagnoses:  Acute conjunctivitis of left eye, unspecified acute conjunctivitis type    New Prescriptions Discharge Medication List as of 03/04/2016 11:57 AM    START taking these medications   Details  cephALEXin (KEFLEX) 250 MG/5ML suspension Take 10 mLs (500 mg total) by mouth 2 (two) times daily., Starting Thu 03/04/2016, Until Thu 03/11/2016, Print    sulfamethoxazole-trimethoprim (BACTRIM DS,SEPTRA DS) 800-160 MG tablet Take 1 tablet by mouth daily., Starting Thu 03/04/2016, Print       I personally performed the services described in this documentation, which was scribed in my presence. The recorded information has been reviewed and is accurate.    Bethel Born, PA-C 03/04/16 1318    Rolland Porter, MD 03/12/16 [phone]

## 2016-03-04 NOTE — ED Triage Notes (Signed)
Pt reports left eye drainage seen here Monday. Started medications but no improvement in symptoms. Pt with tearing in left eye, decreased vision and reddness.

## 2016-03-06 LAB — CULTURE, GROUP A STREP (THRC)

## 2016-03-10 ENCOUNTER — Encounter (HOSPITAL_COMMUNITY): Payer: Self-pay

## 2016-03-10 ENCOUNTER — Emergency Department (HOSPITAL_COMMUNITY)
Admission: EM | Admit: 2016-03-10 | Discharge: 2016-03-10 | Disposition: A | Discharge status: Home / Self Care | Payer: Federal, State, Local not specified - PPO | Attending: Emergency Medicine | Admitting: Emergency Medicine

## 2016-03-10 DIAGNOSIS — Z87891 Personal history of nicotine dependence: Secondary | ICD-10-CM | POA: Insufficient documentation

## 2016-03-10 DIAGNOSIS — B309 Viral conjunctivitis, unspecified: Secondary | ICD-10-CM | POA: Insufficient documentation

## 2016-03-10 DIAGNOSIS — Z792 Long term (current) use of antibiotics: Secondary | ICD-10-CM | POA: Diagnosis not present

## 2016-03-10 DIAGNOSIS — Z79899 Other long term (current) drug therapy: Secondary | ICD-10-CM | POA: Insufficient documentation

## 2016-03-10 DIAGNOSIS — H5712 Ocular pain, left eye: Secondary | ICD-10-CM | POA: Diagnosis present

## 2016-03-10 MED ORDER — LIDOCAINE HCL 1 % IJ SOLN
INTRAMUSCULAR | Status: AC
  Filled 2016-03-10: qty 20

## 2016-03-10 NOTE — ED Provider Notes (Signed)
[facility]-EMERGENCY DEPT Provider Note   CSN: [id number] Arrival date & time: 03/10/16  1244  By signing my name below, I, Modena JanskyAlbert Thayil, attest that this documentation has been prepared under the direction and in the presence of non-physician practitioner, Fayrene HelperBowie Alegra Rost, PA-C. Electronically Signed: Modena JanskyAlbert Thayil, Scribe. 03/10/2016. 2:22 PM.  History   Chief Complaint Chief Complaint  Patient presents with  . Eye Pain   The history is provided by the patient. No language interpreter was used.    HPI Comments: Lauren Diaz is a 25 y.o. female who presents to the Emergency Department complaining of constant moderate left eye pain that started about 2 weeks ago. Per medical record, pt was seen in the ED on 03/04/16 for the same complaint and given Keflex and Bactrim. Pt was seen several times prior. She states that she was unable to follow up with an Opthalmolgist and her symptoms have not being improving. Pt reports associated symptoms of photophobia, left eye swelling, left eye redness, worsening blurry vision, and eye crustiness in the morning. She denies any use of eye contacts.    Past Medical History:  Diagnosis Date  . Chlamydia   . Gonorrhea   . Preterm labor   . Trichomonas     Patient Active Problem List   Diagnosis Date Noted  . SVD (spontaneous vaginal delivery) 05/08/2014  . Post-dates pregnancy 05/07/2014  . Short cervical length during pregnancy 02/16/2014  . Preterm labor 02/15/2014    Past Surgical History:  Procedure Laterality Date  . THERAPEUTIC ABORTION      OB History    Gravida Para Term Preterm AB Living   4 2 1 1 1 2    SAB TAB Ectopic Multiple Live Births   0 1   0 2       Home Medications    Prior to Admission medications   Medication Sig Start Date End Date Taking? Authorizing Provider  alum & mag hydroxide-simeth (COMFORT GEL) 200-200-20 MG/5ML suspension Take 30 mLs by mouth every 6 (six) hours as needed for indigestion or heartburn.     Historical Provider, MD  cephALEXin (KEFLEX) 250 MG/5ML suspension Take 10 mLs (500 mg total) by mouth 2 (two) times daily. 03/04/16 03/11/16  Bethel BornKelly Marie Gekas, PA-C  promethazine (PHENERGAN) 25 MG tablet Take 1 tablet (25 mg total) by mouth every 6 (six) hours as needed for nausea. 12/20/14   Heather Laisure, PA-C  sulfamethoxazole-trimethoprim (BACTRIM DS,SEPTRA DS) 800-160 MG tablet Take 1 tablet by mouth daily. 03/04/16   Bethel BornKelly Marie Gekas, PA-C    Family History Family History  Problem Relation Age of Onset  . Heart disease Father   . Cancer Maternal Grandfather     prostate  . Kidney disease Paternal Grandmother   . Diabetes Maternal Grandmother   . Kidney disease Maternal Grandmother   . Cancer Maternal Grandmother   . Cancer Maternal Uncle   . Anesthesia problems Neg Hx     Social History Social History  Substance Use Topics  . Smoking status: Former Smoker    Packs/day: 0.01    Years: 1.00    Types: Cigarettes    Quit date: 09/06/2012  . Smokeless tobacco: Never Used     Comment: quit with pos preg test  . Alcohol use No     Comment: weekends     Allergies   Penicillins   Review of Systems Review of Systems  Constitutional: Negative for fever.  Eyes: Positive for photophobia, pain, redness and visual disturbance.  Physical Exam Updated Vital Signs BP 135/83   Pulse 97   Temp 98.8 F (37.1 C)   Resp 15   LMP 03/04/2016   SpO2 98%   Physical Exam  Constitutional: She appears well-developed and well-nourished. No distress.  HENT:  Head: Normocephalic.  Eyes: EOM are normal. Pupils are equal, round, and reactive to light.  Left eye: Lid everted. Mild erythema. Conjunctiva injected. No chemosis or hyphemas. PERRL. EOM intact.   Neck: Neck supple.  Cardiovascular: Normal rate and regular rhythm.   Pulmonary/Chest: Effort normal.  Abdominal: Soft.  Musculoskeletal: Normal range of motion.  Neurological: She is alert.  Skin: Skin is warm and dry.   Psychiatric: She has a normal mood and affect.  Nursing note and vitals reviewed.    ED Treatments / Results  DIAGNOSTIC STUDIES: Oxygen Saturation is 98% on RA, normal by my interpretation.    COORDINATION OF CARE: 2:26 PM- Pt advised of plan for treatment. Informed pt that a follow-up with an Opthalmologist is necessary. Recommended good hand hygiene to reduce contagiousness. and pt agrees.  Labs (all labs ordered are listed, but only abnormal results are displayed) Labs Reviewed - No data to display  EKG  EKG Interpretation None       Radiology No results found.  Procedures Procedures (including critical care time)  Medications Ordered in ED Medications - No data to display   Initial Impression / Assessment and Plan / ED Course  I have reviewed the triage vital signs and the nursing notes.  Pertinent labs & imaging results that were available during my care of the patient were reviewed by me and considered in my medical decision making (see chart for details).  Clinical Course    BP 135/83   Pulse 97   Temp 98.8 F (37.1 C)   Resp 15   LMP 03/04/2016   SpO2 98%    Pt dx likely viral conjunctivitis based on presentation & eye exam.  Pt has been treated with PO and GTT abx without relief. No evidence of HSV or VSV infection from prior visit. Pt is not a contact lens wearer.  Exam non-concerning for orbital cellulitis, hyphema, corneal ulcers, corneal abrasions or trauma. Patient has been instructed to use cool compresses and practice personal hygiene with frequent hand washing.  Patient understands to follow up with ophthalmology, especially if new symptoms including change in vision, purulent drainage, or entrapment occur.    Final Clinical Impressions(s) / ED Diagnoses   Final diagnoses:  Acute viral conjunctivitis of left eye    New Prescriptions New Prescriptions   No medications on file   I personally performed the services described in this  documentation, which was scribed in my presence. The recorded information has been reviewed and is accurate.       Fayrene Helper, PA-C 03/10/16 1450    Jacalyn Lefevre, MD 03/10/16 [phone]

## 2016-03-10 NOTE — ED Triage Notes (Signed)
Pt seen twice for same.  Pain in left eye.  Medicated and not getting better.  Pain/swelling x 2 weeks.

## 2016-03-10 NOTE — Discharge Instructions (Signed)
Call and follow up with eye specialist today for further care.

## 2016-03-10 NOTE — ED Notes (Signed)
Bed: [facility] Expected date:  Expected time:  Means of arrival:  Comments: 
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
## 2016-05-30 ENCOUNTER — Emergency Department (HOSPITAL_COMMUNITY)
Admission: EM | Admit: 2016-05-30 | Discharge: 2016-05-30 | Disposition: A | Discharge status: Home / Self Care | Payer: Federal, State, Local not specified - PPO | Attending: Emergency Medicine | Admitting: Emergency Medicine

## 2016-05-30 ENCOUNTER — Encounter (HOSPITAL_COMMUNITY): Payer: Self-pay | Admitting: Emergency Medicine

## 2016-05-30 DIAGNOSIS — N390 Urinary tract infection, site not specified: Secondary | ICD-10-CM | POA: Diagnosis not present

## 2016-05-30 DIAGNOSIS — R1084 Generalized abdominal pain: Secondary | ICD-10-CM | POA: Diagnosis present

## 2016-05-30 DIAGNOSIS — Z79899 Other long term (current) drug therapy: Secondary | ICD-10-CM | POA: Diagnosis not present

## 2016-05-30 DIAGNOSIS — Z87891 Personal history of nicotine dependence: Secondary | ICD-10-CM | POA: Insufficient documentation

## 2016-05-30 LAB — COMPREHENSIVE METABOLIC PANEL
ALBUMIN: 3.9 g/dL (ref 3.5–5.0)
ALT: 16 U/L (ref 14–54)
AST: 23 U/L (ref 15–41)
Alkaline Phosphatase: 48 U/L (ref 38–126)
Anion gap: 10 (ref 5–15)
BUN: 9 mg/dL (ref 6–20)
CHLORIDE: 105 mmol/L (ref 101–111)
CO2: 21 mmol/L — ABNORMAL LOW (ref 22–32)
Calcium: 9.3 mg/dL (ref 8.9–10.3)
Creatinine, Ser: 0.87 mg/dL (ref 0.44–1.00)
GFR calc Af Amer: >60 mL/min (ref >60)
Glucose, Bld: 125 mg/dL — ABNORMAL HIGH (ref 65–99)
POTASSIUM: 3.9 mmol/L (ref 3.5–5.1)
SODIUM: 136 mmol/L (ref 135–145)
Total Bilirubin: 0.9 mg/dL (ref 0.3–1.2)
Total Protein: 7.2 g/dL (ref 6.5–8.1)

## 2016-05-30 LAB — URINALYSIS, ROUTINE W REFLEX MICROSCOPIC
BILIRUBIN URINE: NEGATIVE
Glucose, UA: NEGATIVE mg/dL
Hgb urine dipstick: NEGATIVE
KETONES UR: 20 mg/dL — AB
Nitrite: NEGATIVE
PROTEIN: 100 mg/dL — AB
Specific Gravity, Urine: 1.025 (ref 1.005–1.030)
pH: 9 — ABNORMAL HIGH (ref 5.0–8.0)

## 2016-05-30 LAB — CBC
HEMATOCRIT: 38.1 % (ref 36.0–46.0)
Hemoglobin: 12.9 g/dL (ref 12.0–15.0)
MCH: 30.1 pg (ref 26.0–34.0)
MCHC: 33.9 g/dL (ref 30.0–36.0)
MCV: 89 fL (ref 78.0–100.0)
Platelets: 264 10*3/uL (ref 150–400)
RBC: 4.28 MIL/uL (ref 3.87–5.11)
RDW: 13.8 % (ref 11.5–15.5)
WBC: 12.6 10*3/uL — AB (ref 4.0–10.5)

## 2016-05-30 LAB — HCG, QUANTITATIVE, PREGNANCY: hCG, Beta Chain, Quant, S: 62 m[IU]/mL — ABNORMAL HIGH (ref <5)

## 2016-05-30 LAB — I-STAT BETA HCG BLOOD, ED (MC, WL, AP ONLY): HCG, QUANTITATIVE: 49.2 m[IU]/mL — AB (ref <5)

## 2016-05-30 LAB — LIPASE, BLOOD: Lipase: 14 U/L (ref 11–51)

## 2016-05-30 MED ORDER — SODIUM CHLORIDE 0.9 % IV BOLUS (SEPSIS)
2000.0000 mL | Freq: Once | INTRAVENOUS | Status: AC
Start: 2016-05-30 — End: 2016-05-30
  Administered 2016-05-30: 2000 mL via INTRAVENOUS

## 2016-05-30 MED ORDER — ONDANSETRON HCL 4 MG/2ML IJ SOLN
4.0000 mg | Freq: Once | INTRAMUSCULAR | Status: AC | PRN
  Administered 2016-05-30: 4 mg via INTRAVENOUS
  Filled 2016-05-30: qty 2

## 2016-05-30 MED ORDER — SODIUM CHLORIDE 0.9 % IV SOLN: INTRAVENOUS | Status: DC

## 2016-05-30 MED ORDER — METOCLOPRAMIDE HCL 5 MG/ML IJ SOLN
5.0000 mg | Freq: Once | INTRAMUSCULAR | Status: AC
  Administered 2016-05-30: 5 mg via INTRAVENOUS
  Filled 2016-05-30: qty 2

## 2016-05-30 MED ORDER — ONDANSETRON 8 MG PO TBDP: 8.0000 mg | ORAL_TABLET | Freq: Three times a day (TID) | ORAL | 0 refills | Status: DC | PRN

## 2016-05-30 MED ORDER — CEPHALEXIN 500 MG PO CAPS: 500.0000 mg | ORAL_CAPSULE | Freq: Four times a day (QID) | ORAL | 0 refills | Status: DC

## 2016-05-30 NOTE — ED Notes (Signed)
Patient able to hold down ice chips, helped to restroom, reports mild nausea with ambulation

## 2016-05-30 NOTE — ED Notes (Signed)
EDP at bedside  

## 2016-05-30 NOTE — ED Triage Notes (Signed)
Per EMS, pt from home with c/o n/v, abdominal cramping since 2300 last night. BP-132/81, HR-106, RR_16, SpO2-98% ra

## 2016-05-30 NOTE — ED Notes (Signed)
Fluids finishing infusing

## 2016-05-30 NOTE — ED Provider Notes (Signed)
[facility]-EMERG[facility]Y DEPT Provider Note   CSN: [id number] Arrival date & time: 05/30/16  7829     History   Chief Complaint Chief Complaint  Patient presents with  . Emesis  . Abdominal Pain    HPI Lauren Diaz is a 26 y.o. female.  26 year old female presents with 24-hour history of nausea vomiting and nonbilious emesis. Has had diffuse abdominal cramping without fever or chills. No diarrhea. Denies any URI symptoms. No dysuria or hematuria. Has also noted increased weakness due to decreased oral intake. Last menstrual period was last month. No vaginal bleeding or discharge No treatment use prior to arrival called EMS and was transported here.      Past Medical History:  Diagnosis Date  . Chlamydia   . Gonorrhea   . Preterm labor   . Trichomonas     Patient Active Problem List   Diagnosis Date Noted  . SVD (spontaneous vaginal delivery) 05/08/2014  . Post-dates pregnancy 05/07/2014  . Short cervical length during pregnancy 02/16/2014  . Preterm labor 02/15/2014    Past Surgical History:  Procedure Laterality Date  . THERAPEUTIC ABORTION      OB History    Gravida Para Term Preterm AB Living   4 2 1 1 1 2    SAB TAB Ectopic Multiple Live Births   0 1   0 2       Home Medications    Prior to Admission medications   Medication Sig Start Date End Date Taking? Authorizing Provider  alum & mag hydroxide-simeth (COMFORT GEL) 200-200-20 MG/5ML suspension Take 30 mLs by mouth every 6 (six) hours as needed for indigestion or heartburn.    Historical Provider, MD  promethazine (PHENERGAN) 25 MG tablet Take 1 tablet (25 mg total) by mouth every 6 (six) hours as needed for nausea. 12/20/14   Heather Laisure, PA-C  sulfamethoxazole-trimethoprim (BACTRIM DS,SEPTRA DS) 800-160 MG tablet Take 1 tablet by mouth daily. 03/04/16   Bethel Born, PA-C    Family History Family History  Problem Relation Age of Onset  . Heart disease Father   . Cancer Maternal Grandfather      prostate  . Kidney disease Paternal Grandmother   . Diabetes Maternal Grandmother   . Kidney disease Maternal Grandmother   . Cancer Maternal Grandmother   . Cancer Maternal Uncle   . Anesthesia problems Neg Hx     Social History Social History  Substance Use Topics  . Smoking status: Former Smoker    Packs/day: 0.01    Years: 1.00    Types: Cigarettes    Quit date: 09/06/2012  . Smokeless tobacco: Never Used     Comment: quit with pos preg test  . Alcohol use Yes     Comment: weekends     Allergies   Penicillins   Review of Systems Review of Systems  All other systems reviewed and are negative.    Physical Exam Updated Vital Signs BP 100/63 (BP Location: Right Arm)   Pulse 105   Temp 99.1 F (37.3 C) (Oral)   Resp 16   Ht 5\' 6"  (1.676 m)   Wt 99.8 kg   LMP 04/29/2016   SpO2 100%   BMI 35.51 kg/m   Physical Exam  Constitutional: She is oriented to person, place, and time. She appears well-developed and well-nourished.  Non-toxic appearance. No distress.  HENT:  Head: Normocephalic and atraumatic.  Eyes: Conjunctivae, EOM and lids are normal. Pupils are equal, round, and reactive to light.  Neck: Normal range of motion. Neck supple. No tracheal deviation present. No thyroid mass present.  Cardiovascular: Normal rate, regular rhythm and normal heart sounds.  Exam reveals no gallop.   No murmur heard. Pulmonary/Chest: Effort normal and breath sounds normal. No stridor. No respiratory distress. She has no decreased breath sounds. She has no wheezes. She has no rhonchi. She has no rales.  Abdominal: Soft. Normal appearance and bowel sounds are normal. She exhibits no distension. There is tenderness in the epigastric area. There is no rebound and no CVA tenderness.    Musculoskeletal: Normal range of motion. She exhibits no edema or tenderness.  Neurological: She is alert and oriented to person, place, and time. She has normal strength. No cranial nerve  deficit or sensory deficit. GCS eye subscore is 4. GCS verbal subscore is 5. GCS motor subscore is 6.  Skin: Skin is warm and dry. No abrasion and no rash noted.  Psychiatric: She has a normal mood and affect. Her speech is normal and behavior is normal.  Nursing note and vitals reviewed.    ED Treatments / Results  Labs (all labs ordered are listed, but only abnormal results are displayed) Labs Reviewed  COMPREHENSIVE METABOLIC PANEL - Abnormal; Notable for the following:       Result Value   CO2 21 (*)    Glucose, Bld 125 (*)    All other components within normal limits  I-STAT BETA HCG BLOOD, ED ([facility], [facility], [facility] ONLY) - Abnormal; Notable for the following:    I-stat hCG, quantitative 49.2 (*)    All other components within normal limits  LIPASE, BLOOD  CBC  URINALYSIS, ROUTINE W REFLEX MICROSCOPIC  HCG, QUANTITATIVE, PREGNANCY    EKG  EKG Interpretation None       Radiology No results found.  Procedures Procedures (including critical care time)  Medications Ordered in ED Medications  0.9 %  sodium chloride infusion (not administered)  ondansetron (ZOFRAN) injection 4 mg (4 mg Intravenous Given 05/30/16 0745)  sodium chloride 0.9 % bolus 2,000 mL (2,000 mLs Intravenous New Bag/Given 05/30/16 0749)     Initial Impression / Assessment and Plan / ED Course  I have reviewed the triage vital signs and the nursing notes.  Pertinent labs & imaging results that were available during my care of the patient were reviewed by me and considered in my medical decision making (see chart for details).  Clinical Course     Patient treated here with IV fluids anti-medics.  Patient given Zofran when necessary for vomiting as well as placed on antibiotics for suspected UTI. Final Clinical Impressions(s) / ED Diagnoses   Final diagnoses:  None    New Prescriptions New Prescriptions   No medications on file     Lorre NickAnthony Savanna Dooley, MD 05/30/16 1121

## 2016-05-30 NOTE — ED Notes (Addendum)
Patient given warm blankets and updated on POC. States nausea mildly relieved with medication

## 2016-05-30 NOTE — ED Notes (Signed)
gingerale and ice chips given for sipping, patient states mouth is very dry

## 2016-07-05 ENCOUNTER — Inpatient Hospital Stay (HOSPITAL_COMMUNITY): Payer: Federal, State, Local not specified - PPO

## 2016-07-05 ENCOUNTER — Encounter (HOSPITAL_COMMUNITY): Payer: Self-pay | Admitting: *Deleted

## 2016-07-05 ENCOUNTER — Inpatient Hospital Stay (HOSPITAL_COMMUNITY)
Admission: AD | Admit: 2016-07-05 | Discharge: 2016-07-06 | Disposition: A | Discharge status: Home / Self Care | Payer: Federal, State, Local not specified - PPO | Source: Ambulatory Visit | Attending: Obstetrics and Gynecology | Admitting: Obstetrics and Gynecology

## 2016-07-05 DIAGNOSIS — O4690 Antepartum hemorrhage, unspecified, unspecified trimester: Secondary | ICD-10-CM

## 2016-07-05 DIAGNOSIS — O3680X0 Pregnancy with inconclusive fetal viability, not applicable or unspecified: Secondary | ICD-10-CM | POA: Diagnosis not present

## 2016-07-05 DIAGNOSIS — Z880 Allergy status to penicillin: Secondary | ICD-10-CM | POA: Diagnosis not present

## 2016-07-05 DIAGNOSIS — O209 Hemorrhage in early pregnancy, unspecified: Secondary | ICD-10-CM | POA: Insufficient documentation

## 2016-07-05 DIAGNOSIS — Z87891 Personal history of nicotine dependence: Secondary | ICD-10-CM | POA: Insufficient documentation

## 2016-07-05 DIAGNOSIS — Z3A08 8 weeks gestation of pregnancy: Secondary | ICD-10-CM | POA: Diagnosis not present

## 2016-07-05 DIAGNOSIS — R1030 Lower abdominal pain, unspecified: Secondary | ICD-10-CM | POA: Diagnosis present

## 2016-07-05 LAB — URINALYSIS, ROUTINE W REFLEX MICROSCOPIC
BILIRUBIN URINE: NEGATIVE
Glucose, UA: NEGATIVE mg/dL
Ketones, ur: NEGATIVE mg/dL
LEUKOCYTES UA: NEGATIVE
Nitrite: NEGATIVE
Protein, ur: 30 mg/dL — AB
SPECIFIC GRAVITY, URINE: 1.027 (ref 1.005–1.030)
pH: 7 (ref 5.0–8.0)

## 2016-07-05 LAB — HCG, QUANTITATIVE, PREGNANCY: HCG, BETA CHAIN, QUANT, S: 1783 m[IU]/mL — AB (ref <5)

## 2016-07-05 LAB — CBC
HCT: 34.8 % — ABNORMAL LOW (ref 36.0–46.0)
Hemoglobin: 11.6 g/dL — ABNORMAL LOW (ref 12.0–15.0)
MCH: 29.8 pg (ref 26.0–34.0)
MCHC: 33.3 g/dL (ref 30.0–36.0)
MCV: 89.5 fL (ref 78.0–100.0)
PLATELETS: 275 10*3/uL (ref 150–400)
RBC: 3.89 MIL/uL (ref 3.87–5.11)
RDW: 13.6 % (ref 11.5–15.5)
WBC: 6.4 10*3/uL (ref 4.0–10.5)

## 2016-07-05 NOTE — MAU Note (Addendum)
PT  SAYS ON Friday  SHE  FELL-   THEN YESTERDAY  AT 12NOON -  WIPED  - SAW LIGHT  PINK  BLOOD  THEN  LATER BLED HEAVIER  AND  CRAMPING.       HAD  POSITIVE  PREG  TEST  [facility] .        TRIAGE-  PAD ON-        SMALL  AMT  LIGHT   BROWN   .   TOOK IBUPROFEN  600MG   AT  2PM

## 2016-07-06 ENCOUNTER — Other Ambulatory Visit: Payer: Self-pay | Admitting: Advanced Practice Midwife

## 2016-07-06 ENCOUNTER — Encounter (HOSPITAL_COMMUNITY): Payer: Self-pay | Admitting: Advanced Practice Midwife

## 2016-07-06 DIAGNOSIS — B9689 Other specified bacterial agents as the cause of diseases classified elsewhere: Secondary | ICD-10-CM

## 2016-07-06 DIAGNOSIS — O4690 Antepartum hemorrhage, unspecified, unspecified trimester: Secondary | ICD-10-CM

## 2016-07-06 DIAGNOSIS — N760 Acute vaginitis: Principal | ICD-10-CM

## 2016-07-06 DIAGNOSIS — O3680X0 Pregnancy with inconclusive fetal viability, not applicable or unspecified: Secondary | ICD-10-CM | POA: Diagnosis not present

## 2016-07-06 LAB — GC/CHLAMYDIA PROBE AMP (~~LOC~~) NOT AT ARMC
Chlamydia: NEGATIVE
NEISSERIA GONORRHEA: NEGATIVE

## 2016-07-06 LAB — WET PREP, GENITAL
Sperm: NONE SEEN
Trich, Wet Prep: NONE SEEN
Yeast Wet Prep HPF POC: NONE SEEN

## 2016-07-06 MED ORDER — METRONIDAZOLE 500 MG PO TABS: 500.0000 mg | ORAL_TABLET | Freq: Two times a day (BID) | ORAL | 0 refills | Status: DC

## 2016-07-06 NOTE — Discharge Instructions (Signed)
Vaginal Bleeding During Pregnancy, First Trimester °A small amount of bleeding (spotting) from the vagina is relatively common in early pregnancy. It usually stops on its own. Various things may cause bleeding or spotting in early pregnancy. Some bleeding may be related to the pregnancy, and some may not. In most cases, the bleeding is normal and is not a problem. However, bleeding can also be a sign of something serious. Be sure to tell your health care provider about any vaginal bleeding right away. °Some possible causes of vaginal bleeding during the first trimester include: °· Infection or inflammation of the cervix. °· Growths (polyps) on the cervix. °· Miscarriage or threatened miscarriage. °· Pregnancy tissue has developed outside of the uterus and in a fallopian tube (tubal pregnancy). °· Tiny cysts have developed in the uterus instead of pregnancy tissue (molar pregnancy). °Follow these instructions at home: °Watch your condition for any changes. The following actions may help to lessen any discomfort you are feeling: °· Follow your health care provider's instructions for limiting your activity. If your health care provider orders bed rest, you may need to stay in bed and only get up to use the bathroom. However, your health care provider may allow you to continue light activity. °· If needed, make plans for someone to help with your regular activities and responsibilities while you are on bed rest. °· Keep track of the number of pads you use each day, how often you change pads, and how soaked (saturated) they are. Write this down. °· Do not use tampons. Do not douche. °· Do not have sexual intercourse or orgasms until approved by your health care provider. °· If you pass any tissue from your vagina, save the tissue so you can show it to your health care provider. °· Only take over-the-counter or prescription medicines as directed by your health care provider. °· Do not take aspirin because it can make you  bleed. °· Keep all follow-up appointments as directed by your health care provider. °Contact a health care provider if: °· You have any vaginal bleeding during any part of your pregnancy. °· You have cramps or labor pains. °· You have a fever, not controlled by medicine. °Get help right away if: °· You have severe cramps in your back or belly (abdomen). °· You pass large clots or tissue from your vagina. °· Your bleeding increases. °· You feel light-headed or weak, or you have fainting episodes. °· You have chills. °· You are leaking fluid or have a gush of fluid from your vagina. °· You pass out while having a bowel movement. °This information is not intended to replace advice given to you by your health care provider. Make sure you discuss any questions you have with your health care provider. °Document Released: 02/17/2005 Document Revised: 10/16/2015 Document Reviewed: 01/15/2013 °Elsevier Interactive Patient Education © 2017 [employer] ° °

## 2016-07-06 NOTE — MAU Note (Signed)
Pt fell Saturday around 1 am. Pt reports bleeding and cramping since noon Saturday.

## 2016-07-06 NOTE — MAU Provider Note (Signed)
Chief Complaint: Vaginal Bleeding   First Provider Initiated Contact with Patient 07/06/16 0140      SUBJECTIVE HPI: Lauren Diaz is a 26 y.o. [id number] at [redacted]w[redacted]d who presents to [facility] reporting mild low abd cramping x 2-3 days and vaginal bleeding since yesterday that started light and got as heavy as a period. Passed small clots. Unsure if she passed tissue. Pt fell the day the cramping started. No cramping now.  Was seen at [facility] ED 05/30/16. Quant was 17  Location: Suprapubic Quality: cramping Severity: 2/10 on pain scale at worst Duration: 2-3 days Course: improving Context: early pregnancy Timing: intermittent Modifying factors: Resolved w/ Ibuprofen Associated signs and symptoms: Pos for vaginal bleeding.   Past Medical History:  Diagnosis Date  . Chlamydia   . Gonorrhea   . Preterm labor   . Trichomonas    OB History  Gravida Para Term Preterm AB Living  5 2 1 1 2 2   SAB TAB Ectopic Multiple Live Births  0 2   0 2    # Outcome Date GA Lbr Len/2nd Weight Sex Delivery Anes PTL Lv  5 Current           4 Term 05/08/14 [redacted]w[redacted]d 01:57 / 00:15 8 lb 14.2 oz (4.03 kg) F Vag-Spont EPI  LIV     Birth Comments: caput  3 Preterm 04/14/13 [redacted]w[redacted]d 819:40 / 00:15  M Vag-Spont EPI  LIV  2 TAB 12/2011          1 TAB              Past Surgical History:  Procedure Laterality Date  . THERAPEUTIC ABORTION     Social History   Social History  . Marital status: Single    Spouse name: N/A  . Number of children: N/A  . Years of education: N/A   Occupational History  . Not on file.   Social History Main Topics  . Smoking status: Former Smoker    Packs/day: 0.01    Years: 1.00    Types: Cigarettes    Quit date: 09/06/2012  . Smokeless tobacco: Never Used     Comment: quit with pos preg test  . Alcohol use Yes     Comment: weekends  . Drug use: No  . Sexual activity: Yes    Birth control/ protection: None   Other Topics Concern  . Not on file   Social History  Narrative  . No narrative on file   No current facility-administered medications on file prior to encounter.    Current Outpatient Prescriptions on File Prior to Encounter  Medication Sig Dispense Refill  . cephALEXin (KEFLEX) 500 MG capsule Take 1 capsule (500 mg total) by mouth 4 (four) times daily. 28 capsule 0  . ondansetron (ZOFRAN ODT) 8 MG disintegrating tablet Take 1 tablet (8 mg total) by mouth every 8 (eight) hours as needed for nausea or vomiting. 20 tablet 0  . promethazine (PHENERGAN) 25 MG tablet Take 1 tablet (25 mg total) by mouth every 6 (six) hours as needed for nausea. (Patient not taking: Reported on 05/30/2016) 20 tablet 0   Allergies  Allergen Reactions  . Penicillins Rash and Other (See Comments)    Has patient had a PCN reaction causing immediate rash, facial/tongue/throat swelling, SOB or lightheadedness with hypotension: No Has patient had a PCN reaction causing severe rash involving mucus membranes or skin necrosis: No Has patient had a PCN reaction that required hospitalization No Has patient had  a PCN reaction occurring within the last 10 years: No If all of the above answers are "NO", then may proceed with Cephalosporin use.   Childhood reaction    I have reviewed the past Medical Hx, Surgical Hx, Social Hx, Allergies and Medications.   Review of Systems  Constitutional: Negative for chills and fever.  Gastrointestinal: Positive for abdominal pain. Negative for abdominal distention, constipation, diarrhea, nausea and vomiting.  Genitourinary: Positive for vaginal bleeding. Negative for dysuria, flank pain, frequency, hematuria, urgency and vaginal discharge.  Neurological: Negative for dizziness.    OBJECTIVE Patient Vitals for the past 24 hrs:  BP Temp Temp src Pulse Resp Height Weight  07/06/16 0133 (!) 107/52 98.3 F (36.8 C) - 69 18 - -  07/05/16 2227 105/62 99.6 F (37.6 C) Oral 74 20 5\' 6"  (1.676 m) 207 lb 4 oz (94 kg)   Constitutional:  Well-developed, well-nourished female in no acute distress.  Cardiovascular: normal rate Respiratory: normal rate and effort.  GI: Abd soft, non-tender. Pos BS x 4 MS: Extremities nontender, no edema, normal ROM Neurologic: Alert and oriented x 4.  GU: Neg CVAT.  SPECULUM EXAM: NEFG, physiologic discharge, small amount of dark red blood and fragment of tissue noted in vaginal vault, cervix clean  BIMANUAL: cervix 0.5 cm dilated; uterus normal size, no adnexal tenderness or masses.  No CMT.  LAB RESULTS Results for orders placed or performed during the hospital encounter of 07/05/16 (from the past 24 hour(s))  Urinalysis, Routine w reflex microscopic     Status: Abnormal   Collection Time: 07/05/16 10:29 PM  Result Value Ref Range   Color, Urine YELLOW YELLOW   APPearance CLEAR CLEAR   Specific Gravity, Urine 1.027 1.005 - 1.030   pH 7.0 5.0 - 8.0   Glucose, UA NEGATIVE NEGATIVE mg/dL   Hgb urine dipstick MODERATE (A) NEGATIVE   Bilirubin Urine NEGATIVE NEGATIVE   Ketones, ur NEGATIVE NEGATIVE mg/dL   Protein, ur 30 (A) NEGATIVE mg/dL   Nitrite NEGATIVE NEGATIVE   Leukocytes, UA NEGATIVE NEGATIVE   RBC / HPF 6-30 0 - 5 RBC/hpf   WBC, UA 0-5 0 - 5 WBC/hpf   Bacteria, UA RARE (A) NONE SEEN   Squamous Epithelial / LPF 0-5 (A) NONE SEEN   Mucous PRESENT   hCG, quantitative, pregnancy     Status: Abnormal   Collection Time: 07/05/16 10:54 PM  Result Value Ref Range   hCG, Beta Chain, Quant, S 1,783 (H) <5 mIU/mL  CBC     Status: Abnormal   Collection Time: 07/05/16 10:54 PM  Result Value Ref Range   WBC 6.4 4.0 - 10.5 K/uL   RBC 3.89 3.87 - 5.11 MIL/uL   Hemoglobin 11.6 (L) 12.0 - 15.0 g/dL   HCT 40.9 (L) 81.1 - 91.4 %   MCV 89.5 78.0 - 100.0 fL   MCH 29.8 26.0 - 34.0 pg   MCHC 33.3 30.0 - 36.0 g/dL   RDW 78.2 95.6 - 21.3 %   Platelets 275 150 - 400 K/uL    IMAGING US Ob Comp Less 14 Wks  Result Date: 07/06/2016 CLINICAL DATA:  Bleeding and cramping today. Estimated  gestational age by LMP is 8 weeks 3 days. Quantitative beta HCG is pending. EXAM: OBSTETRIC <14 WK [place] AND TRANSVAGINAL OB US TECHNIQUE: Both transabdominal and transvaginal ultrasound examinations were performed for complete evaluation of the gestation as well as the maternal uterus, adnexal regions, and pelvic cul-de-sac. Transvaginal technique was performed to assess early  pregnancy. COMPARISON:  None. FINDINGS: Intrauterine gestational sac: No intrauterine gestational sac is identified. Yolk sac:  Not Visualized. Embryo:  Not Visualized. Cardiac Activity: Not Visualized. Maternal uterus/adnexae: Uterus is anteverted. No myometrial mass lesions are identified. Endometrium is mildly prominent at 17 mm thickness. Endometrium appears mildly heterogeneous but no intrauterine fluid collections are demonstrated. Small nabothian cysts in the cervix. Both ovaries are visualized and appear normal. The right ovary measures 2.5 x 1.9 x 2.4 cm. The left ovary measures 3 x 3 x 1.7 cm. Normal follicular changes are demonstrated. Flow is demonstrated in both ovaries on color flow Doppler imaging. No abnormal adnexal masses. No free fluid in the pelvis. IMPRESSION: No intrauterine gestational sac, yolk sac, or fetal pole identified. Differential considerations include intrauterine pregnancy too early to be sonographically visualized, missed abortion, or ectopic pregnancy. Assuming a positive pregnancy test, Followup ultrasound is recommended in 10-14 days for further evaluation. Electronically Signed   By: Burman NievesWilliam  Stevens M.D.   On: 07/06/2016 01:04   [place] Ob Transvaginal  Result Date: 07/06/2016 CLINICAL DATA:  Bleeding and cramping today. Estimated gestational age by LMP is 8 weeks 3 days. Quantitative beta HCG is pending. EXAM: OBSTETRIC <14 WK US AND TRANSVAGINAL OB US TECHNIQUE: Both transabdominal and transvaginal ultrasound examinations were performed for complete evaluation of the gestation as well as the maternal  uterus, adnexal regions, and pelvic cul-de-sac. Transvaginal technique was performed to assess early pregnancy. COMPARISON:  None. FINDINGS: Intrauterine gestational sac: No intrauterine gestational sac is identified. Yolk sac:  Not Visualized. Embryo:  Not Visualized. Cardiac Activity: Not Visualized. Maternal uterus/adnexae: Uterus is anteverted. No myometrial mass lesions are identified. Endometrium is mildly prominent at 17 mm thickness. Endometrium appears mildly heterogeneous but no intrauterine fluid collections are demonstrated. Small nabothian cysts in the cervix. Both ovaries are visualized and appear normal. The right ovary measures 2.5 x 1.9 x 2.4 cm. The left ovary measures 3 x 3 x 1.7 cm. Normal follicular changes are demonstrated. Flow is demonstrated in both ovaries on color flow Doppler imaging. No abnormal adnexal masses. No free fluid in the pelvis. IMPRESSION: No intrauterine gestational sac, yolk sac, or fetal pole identified. Differential considerations include intrauterine pregnancy too early to be sonographically visualized, missed abortion, or ectopic pregnancy. Assuming a positive pregnancy test, Followup ultrasound is recommended in 10-14 days for further evaluation. Electronically Signed   By: Burman NievesWilliam  Stevens M.D.   On: 07/06/2016 01:04    MAU COURSE CBC, Quant, ABO/Rh, ultrasound, wet prep and GC/chlamydia culture, UA, tissue specimen sent to pathology.   MDM Pain and bleeding in early pregnancy with pregnancy of unknown anatomic location, but hemodynamically stable. W/ absence of GS in uterus, abnormal rise in quant and presence of tissue in vaginal vault, suspect SAB, but cannot exclude non-visualized ectopic.    ASSESSMENT 1. Pregnancy of unknown anatomic location   2. Vaginal bleeding during pregnancy, antepartum     PLAN Discharge home in stable condition. SAB, ectopic precautions GC/Chlamydia cultures and pathology pending Follow-up Information    [facility] 07[facility]2016.   Specialty:  Obstetrics and Gynecology Why:  at 11:00 (or as soon as possible afterward) for repeat blood work.  Contact information: [address] [place] [id number] [phone]       [facility] ADMISS[facility] Follow up.   Why:  as needed in emergencies Contact information: [address] [id number] [place]c [place] [id number] [phone]  Allergies as of 07/06/2016      Reactions   Penicillins Rash, Other (See Comments)   Has patient had a PCN reaction causing immediate rash, facial/tongue/throat swelling, SOB or lightheadedness with hypotension: No Has patient had a PCN reaction causing severe rash involving mucus membranes or skin necrosis: No Has patient had a PCN reaction that required hospitalization No Has patient had a PCN reaction occurring within the last 10 years: No If all of the above answers are "NO", then may proceed with Cephalosporin use. Childhood reaction      Medication List    STOP taking these medications   cephALEXin 500 MG capsule Commonly known as:  KEFLEX   ondansetron 8 MG disintegrating tablet Commonly known as:  ZOFRAN ODT   promethazine 25 MG tablet Commonly known as:  PHENERGAN      Dorathy Kinsman, CNM 07/06/2016  1:39 AM  4

## 2016-07-06 NOTE — Progress Notes (Signed)
Dx BV. Rx Flagyl. Pt left MAU prior to results.

## 2016-07-07 LAB — HIV ANTIBODY (ROUTINE TESTING W REFLEX): HIV SCREEN 4TH GENERATION: NONREACTIVE

## 2016-07-08 ENCOUNTER — Ambulatory Visit: Payer: Federal, State, Local not specified - PPO

## 2016-07-14 ENCOUNTER — Telehealth: Payer: Self-pay | Admitting: General Practice

## 2016-07-14 DIAGNOSIS — O3680X0 Pregnancy with inconclusive fetal viability, not applicable or unspecified: Secondary | ICD-10-CM

## 2016-07-14 NOTE — Telephone Encounter (Signed)
Per Dr Alysia PennaErvin, patient needs ultrasound 2/27 @ 2pm. Called and informed patient of appt and brief nurse visit to follow for results. Patient verbalized understanding & asked for test results. Informed patient of wet prep results & surg path. Patient verbalized understanding to all & had no questions

## 2016-07-15 ENCOUNTER — Telehealth: Payer: Self-pay | Admitting: *Deleted

## 2016-07-15 NOTE — Telephone Encounter (Signed)
Per message from Lauren Diaz, CNM need to have patient come in a quant before her us if possible.   I called [facility] and left a message we are calling to schedule a lab appt- please call our office.

## 2016-07-19 NOTE — Telephone Encounter (Signed)
I called Akiah and she states she is not sure if she can come today- has to get a Arts administratorbaby sitter- if she can come, she will call back to tell us what time she is arriving- if she can't come today, will do her bhcg tomorrow

## 2016-07-20 ENCOUNTER — Ambulatory Visit (HOSPITAL_COMMUNITY): Payer: Federal, State, Local not specified - PPO | Attending: Obstetrics and Gynecology

## 2016-07-20 ENCOUNTER — Other Ambulatory Visit: Payer: Federal, State, Local not specified - PPO

## 2016-07-20 ENCOUNTER — Ambulatory Visit: Payer: Federal, State, Local not specified - PPO

## 2016-07-20 ENCOUNTER — Telehealth: Payer: Self-pay

## 2016-07-20 DIAGNOSIS — O3680X0 Pregnancy with inconclusive fetal viability, not applicable or unspecified: Secondary | ICD-10-CM

## 2016-07-20 NOTE — Telephone Encounter (Signed)
Patient presented to the clinic today for her repeat BHCG. According to the notes in the chart patient had an ultrasound appointment scheduled but miss this appointment. Will discuss with the provider once patient lab work comes back.  Patient has been given results.

## 2016-07-21 ENCOUNTER — Telehealth: Payer: Self-pay | Admitting: General Practice

## 2016-07-21 LAB — BETA HCG QUANT (REF LAB): hCG Quant: 3 m[IU]/mL

## 2016-07-21 NOTE — Telephone Encounter (Signed)
Per [name], patient has had complete resolution of pregnancy- may offer f/u appt in 1-2 weeks. Called patient & informed her of results & offered appt. Patient would like appt to discuss contraception. Told patient someone from the front office will call her to set up that appt. Patient verbalized understanding and had no questions.

## 2016-08-26 ENCOUNTER — Ambulatory Visit: Payer: Federal, State, Local not specified - PPO | Admitting: Advanced Practice Midwife

## 2016-10-11 ENCOUNTER — Encounter (HOSPITAL_COMMUNITY): Payer: Self-pay | Admitting: Emergency Medicine

## 2016-10-11 ENCOUNTER — Emergency Department (HOSPITAL_COMMUNITY)
Admission: EM | Admit: 2016-10-11 | Discharge: 2016-10-11 | Disposition: A | Discharge status: Home / Self Care | Payer: Federal, State, Local not specified - PPO | Attending: Emergency Medicine | Admitting: Emergency Medicine

## 2016-10-11 DIAGNOSIS — J029 Acute pharyngitis, unspecified: Secondary | ICD-10-CM | POA: Diagnosis not present

## 2016-10-11 DIAGNOSIS — Z87891 Personal history of nicotine dependence: Secondary | ICD-10-CM | POA: Diagnosis not present

## 2016-10-11 DIAGNOSIS — R509 Fever, unspecified: Secondary | ICD-10-CM | POA: Diagnosis present

## 2016-10-11 LAB — COMPREHENSIVE METABOLIC PANEL
ALT: 17 U/L (ref 14–54)
AST: 22 U/L (ref 15–41)
Albumin: 3.3 g/dL — ABNORMAL LOW (ref 3.5–5.0)
Alkaline Phosphatase: 45 U/L (ref 38–126)
Anion gap: 10 (ref 5–15)
BUN: 12 mg/dL (ref 6–20)
CHLORIDE: 107 mmol/L (ref 101–111)
CO2: 23 mmol/L (ref 22–32)
CREATININE: 0.95 mg/dL (ref 0.44–1.00)
Calcium: 9.2 mg/dL (ref 8.9–10.3)
GFR calc Af Amer: >60 mL/min (ref >60)
Glucose, Bld: 141 mg/dL — ABNORMAL HIGH (ref 65–99)
Potassium: 3.7 mmol/L (ref 3.5–5.1)
SODIUM: 140 mmol/L (ref 135–145)
Total Bilirubin: 0.3 mg/dL (ref 0.3–1.2)
Total Protein: 7.3 g/dL (ref 6.5–8.1)

## 2016-10-11 LAB — CBC WITH DIFFERENTIAL/PLATELET
BASOS PCT: 0 %
Basophils Absolute: 0 10*3/uL (ref 0.0–0.1)
EOS PCT: 0 %
Eosinophils Absolute: 0 10*3/uL (ref 0.0–0.7)
HCT: 31.9 % — ABNORMAL LOW (ref 36.0–46.0)
Hemoglobin: 10.6 g/dL — ABNORMAL LOW (ref 12.0–15.0)
LYMPHS ABS: 2.4 10*3/uL (ref 0.7–4.0)
Lymphocytes Relative: 16 %
MCH: 29.9 pg (ref 26.0–34.0)
MCHC: 33.2 g/dL (ref 30.0–36.0)
MCV: 90.1 fL (ref 78.0–100.0)
MONO ABS: 1.5 10*3/uL — AB (ref 0.1–1.0)
Monocytes Relative: 10 %
NEUTROS ABS: 11.2 10*3/uL — AB (ref 1.7–7.7)
Neutrophils Relative %: 74 %
PLATELETS: 328 10*3/uL (ref 150–400)
RBC: 3.54 MIL/uL — ABNORMAL LOW (ref 3.87–5.11)
RDW: 13 % (ref 11.5–15.5)
WBC: 15.1 10*3/uL — ABNORMAL HIGH (ref 4.0–10.5)

## 2016-10-11 LAB — I-STAT CG4 LACTIC ACID, ED
LACTIC ACID, VENOUS: 0.48 mmol/L — AB (ref 0.5–1.9)
Lactic Acid, Venous: 2.94 mmol/L (ref 0.5–1.9)

## 2016-10-11 LAB — MONONUCLEOSIS SCREEN: Mono Screen: NEGATIVE

## 2016-10-11 LAB — URINALYSIS, ROUTINE W REFLEX MICROSCOPIC
Bilirubin Urine: NEGATIVE
GLUCOSE, UA: NEGATIVE mg/dL
Hgb urine dipstick: NEGATIVE
KETONES UR: NEGATIVE mg/dL
LEUKOCYTES UA: NEGATIVE
NITRITE: NEGATIVE
PROTEIN: NEGATIVE mg/dL
Specific Gravity, Urine: 1.014 (ref 1.005–1.030)
pH: 8 (ref 5.0–8.0)

## 2016-10-11 LAB — RAPID STREP SCREEN (MED CTR MEBANE ONLY): STREPTOCOCCUS, GROUP A SCREEN (DIRECT): NEGATIVE

## 2016-10-11 LAB — I-STAT BETA HCG BLOOD, ED (MC, WL, AP ONLY): HCG, QUANTITATIVE: 21.2 m[IU]/mL — AB (ref <5)

## 2016-10-11 MED ORDER — AZITHROMYCIN 250 MG PO TABS: 250.0000 mg | ORAL_TABLET | Freq: Every day | ORAL | 0 refills | Status: AC

## 2016-10-11 MED ORDER — AZITHROMYCIN 250 MG PO TABS
500.0000 mg | ORAL_TABLET | Freq: Once | ORAL | Status: AC
  Administered 2016-10-11: 500 mg via ORAL
  Filled 2016-10-11: qty 2

## 2016-10-11 MED ORDER — DEXAMETHASONE SODIUM PHOSPHATE 10 MG/ML IJ SOLN
10.0000 mg | Freq: Once | INTRAMUSCULAR | Status: AC
  Administered 2016-10-11: 10 mg via INTRAVENOUS
  Filled 2016-10-11: qty 1

## 2016-10-11 MED ORDER — KETOROLAC TROMETHAMINE 30 MG/ML IJ SOLN
15.0000 mg | Freq: Once | INTRAMUSCULAR | Status: AC
  Administered 2016-10-11: 15 mg via INTRAVENOUS
  Filled 2016-10-11: qty 1

## 2016-10-11 MED ORDER — SODIUM CHLORIDE 0.9 % IV BOLUS (SEPSIS)
1000.0000 mL | Freq: Once | INTRAVENOUS | Status: AC
  Administered 2016-10-11: 1000 mL via INTRAVENOUS

## 2016-10-11 MED ORDER — LIDOCAINE VISCOUS 2 % MT SOLN
15.0000 mL | Freq: Once | OROMUCOSAL | Status: AC
  Administered 2016-10-11: 15 mL via OROMUCOSAL
  Filled 2016-10-11: qty 15

## 2016-10-11 NOTE — ED Provider Notes (Signed)
[facility]-EMERGENCY DEPT Provider Note   CSN: [id number] Arrival date & time: 10/11/16  0944     History   Chief Complaint Chief Complaint  Patient presents with  . Fever  . Emesis    HPI Lauren Diaz is a 26 y.o. female.  HPI  Patient presents with concern of sore throat, fever, discomfort, nausea, vomiting. Patient is generally well. Illness began one week ago, since onset has been persistent, with ongoing sore throat, fullness throughout the anterior throat, and with worsening nausea, and new vomiting over the past day. No diarrhea, no abdominal pain, chest pain. She has had transient relief with OTC medication including ibuprofen, cough and cold medication. No confusion, disorientation, other concerns.   Past Medical History:  Diagnosis Date  . Chlamydia   . Gonorrhea   . Preterm labor   . Trichomonas     Patient Active Problem List   Diagnosis Date Noted  . SVD (spontaneous vaginal delivery) 05/08/2014  . Post-dates pregnancy 05/07/2014  . Short cervical length during pregnancy 02/16/2014  . Preterm labor 02/15/2014    Past Surgical History:  Procedure Laterality Date  . THERAPEUTIC ABORTION      OB History    Gravida Para Term Preterm AB Living   5 2 1 1 2 2    SAB TAB Ectopic Multiple Live Births   0 2   0 2       Home Medications    Prior to Admission medications   Medication Sig Start Date End Date Taking? Authorizing Provider  metroNIDAZOLE (FLAGYL) 500 MG tablet Take 1 tablet (500 mg total) by mouth 2 (two) times daily. 07/06/16   Dorathy KinsmanSmith, [place], CNM    Family History Family History  Problem Relation Age of Onset  . Heart disease Father   . Cancer Maternal Grandfather        prostate  . Kidney disease Paternal Grandmother   . Diabetes Maternal Grandmother   . Kidney disease Maternal Grandmother   . Cancer Maternal Grandmother   . Cancer Maternal Uncle   . Anesthesia problems Neg Hx     Social History Social History  Substance  Use Topics  . Smoking status: Former Smoker    Packs/day: 0.01    Years: 1.00    Types: Cigarettes    Quit date: 09/06/2012  . Smokeless tobacco: Never Used     Comment: quit with pos preg test  . Alcohol use Yes     Comment: weekends     Allergies   Penicillins   Review of Systems Review of Systems  Constitutional:       Per HPI, otherwise negative  HENT:       Per HPI, otherwise negative  Respiratory:       Per HPI, otherwise negative  Cardiovascular:       Per HPI, otherwise negative  Gastrointestinal: Positive for nausea and vomiting.  Endocrine:       Negative aside from HPI  Genitourinary:       Neg aside from HPI   Musculoskeletal:       Per HPI, otherwise negative  Skin: Negative.   Neurological: Negative for syncope.     Physical Exam Updated Vital Signs BP 113/72   Pulse (!) 115   Temp (!) 102.9 F (39.4 C) (Oral)   Resp 16   LMP 10/06/2015   SpO2 100%   Breastfeeding? Unknown   Physical Exam  Constitutional: She is oriented to person, place, and time. She appears well-developed  and well-nourished. No distress.  HENT:  Head: Normocephalic and atraumatic.  Mouth/Throat: Oropharyngeal exudate present.  Eyes: Conjunctivae and EOM are normal.  Cardiovascular: Regular rhythm.   Tachycardia  Pulmonary/Chest: Effort normal and breath sounds normal. No stridor. No respiratory distress.  Abdominal: She exhibits no distension and no mass. There is no tenderness. There is no guarding.  Musculoskeletal: She exhibits no edema.  Lymphadenopathy:    She has cervical adenopathy.       Right cervical: Superficial cervical adenopathy present.       Left cervical: Superficial cervical adenopathy present.  Neurological: She is alert and oriented to person, place, and time. No cranial nerve deficit.  Skin: Skin is warm and dry.  Psychiatric: She has a normal mood and affect.  Nursing note and vitals reviewed.    ED Treatments / Results  Labs (all labs  ordered are listed, but only abnormal results are displayed) Labs Reviewed  COMPREHENSIVE METABOLIC PANEL - Abnormal; Notable for the following:       Result Value   Glucose, Bld 141 (*)    Albumin 3.3 (*)    All other components within normal limits  CBC WITH DIFFERENTIAL/PLATELET - Abnormal; Notable for the following:    WBC 15.1 (*)    RBC 3.54 (*)    Hemoglobin 10.6 (*)    HCT 31.9 (*)    Neutro Abs 11.2 (*)    Monocytes Absolute 1.5 (*)    All other components within normal limits  I-STAT CG4 LACTIC ACID, ED - Abnormal; Notable for the following:    Lactic Acid, Venous 2.94 (*)    All other components within normal limits  I-STAT BETA HCG BLOOD, ED ([facility], [facility], [facility] ONLY) - Abnormal; Notable for the following:    I-stat hCG, quantitative 21.2 (*)    All other components within normal limits  I-STAT CG4 LACTIC ACID, ED - Abnormal; Notable for the following:    Lactic Acid, Venous 0.48 (*)    All other components within normal limits  RAPID STREP SCREEN (NOT AT [facility])  CULTURE, GROUP A STREP ([facility])  URINALYSIS, ROUTINE W REFLEX MICROSCOPIC  MONONUCLEOSIS SCREEN    EKG  EKG Interpretation None       Procedures Procedures (including critical care time)  Medications Ordered in ED Medications  azithromycin (ZITHROMAX) tablet 500 mg (not administered)  sodium chloride 0.9 % bolus 1,000 mL (0 mLs Intravenous Stopped 10/11/16 1207)  ketorolac (TORADOL) 30 MG/ML injection 15 mg (15 mg Intravenous Given 10/11/16 1057)  dexamethasone (DECADRON) injection 10 mg (10 mg Intravenous Given 10/11/16 1057)  lidocaine (XYLOCAINE) 2 % viscous mouth solution 15 mL (15 mLs Mouth/Throat Given 10/11/16 1351)  sodium chloride 0.9 % bolus 1,000 mL (1,000 mLs Intravenous New Bag/Given 10/11/16 1540)     Initial Impression / Assessment and Plan / ED Course  I have reviewed the triage vital signs and the nursing notes.  Pertinent labs & imaging results that were available during my care of the  patient were reviewed by me and considered in my medical decision making (see chart for details).  After the initial evaluation patient received IV fluids, Toradol, Decadron, labs sent. Initial labs notable for lactic acidosis, consistent with ongoing inflammatory response.  4:48 PM Patient awake and alert, states that she feels better. Lactic acidosis has resolved and she looks better, feels better, is afebrile. Physical exam findings concerning for strep throat, though initial study was normal. No evidence for mononucleosis. Patient started on antibiotics, will follow-up  with primary care.   Final Clinical Impressions(s) / ED Diagnoses   Final diagnoses:  Sore throat    New Prescriptions New Prescriptions   AZITHROMYCIN (ZITHROMAX) 250 MG TABLET    Take 1 tablet (250 mg total) by mouth daily. Take 1 every day until finished.     Gerhard Munch, MD 10/11/16 [phone]

## 2016-10-11 NOTE — ED Triage Notes (Signed)
Per EMS pt complaint of ongoing throat lymph node enlargement, n/v, and fever for a week. Pt given gram of tylenol en route with EMS.

## 2016-10-11 NOTE — Discharge Instructions (Signed)
As discussed, your evaluation today has been largely reassuring.  But, it is important that you monitor your condition carefully, and do not hesitate to return to the ED if you develop new, or concerning changes in your condition. ? ?Otherwise, please follow-up with your physician for appropriate ongoing care. ? ?

## 2016-10-11 NOTE — ED Notes (Addendum)
Abnormal lab result MD Jeraldine LootsLockwood have been made aware

## 2016-10-13 LAB — CULTURE, GROUP A STREP (THRC)

## 2016-11-19 ENCOUNTER — Encounter (HOSPITAL_COMMUNITY): Payer: Self-pay | Admitting: Emergency Medicine

## 2016-11-19 ENCOUNTER — Emergency Department (HOSPITAL_COMMUNITY)
Admission: EM | Admit: 2016-11-19 | Discharge: 2016-11-19 | Disposition: A | Discharge status: Home / Self Care | Payer: Federal, State, Local not specified - PPO | Attending: Emergency Medicine | Admitting: Emergency Medicine

## 2016-11-19 DIAGNOSIS — Z23 Encounter for immunization: Secondary | ICD-10-CM | POA: Diagnosis not present

## 2016-11-19 DIAGNOSIS — Z87891 Personal history of nicotine dependence: Secondary | ICD-10-CM | POA: Insufficient documentation

## 2016-11-19 DIAGNOSIS — W109XXA Fall (on) (from) unspecified stairs and steps, initial encounter: Secondary | ICD-10-CM | POA: Diagnosis not present

## 2016-11-19 DIAGNOSIS — S91209A Unspecified open wound of unspecified toe(s) with damage to nail, initial encounter: Secondary | ICD-10-CM

## 2016-11-19 DIAGNOSIS — Y999 Unspecified external cause status: Secondary | ICD-10-CM | POA: Diagnosis not present

## 2016-11-19 DIAGNOSIS — S99929A Unspecified injury of unspecified foot, initial encounter: Secondary | ICD-10-CM | POA: Diagnosis present

## 2016-11-19 DIAGNOSIS — Y9301 Activity, walking, marching and hiking: Secondary | ICD-10-CM | POA: Insufficient documentation

## 2016-11-19 DIAGNOSIS — Y929 Unspecified place or not applicable: Secondary | ICD-10-CM | POA: Insufficient documentation

## 2016-11-19 MED ORDER — LIDOCAINE HCL 2 % IJ SOLN
10.0000 mL | Freq: Once | INTRAMUSCULAR | Status: AC
  Administered 2016-11-19: 200 mg via INTRADERMAL
  Filled 2016-11-19: qty 20

## 2016-11-19 MED ORDER — TETANUS-DIPHTH-ACELL PERTUSSIS 5-2.5-18.5 LF-MCG/0.5 IM SUSP
0.5000 mL | Freq: Once | INTRAMUSCULAR | Status: AC
  Administered 2016-11-19: 0.5 mL via INTRAMUSCULAR
  Filled 2016-11-19: qty 0.5

## 2016-11-19 NOTE — Discharge Instructions (Signed)
Apply Neosporin to the toe twice daily for the next few days. You may take ibuprofen or Tylenol as needed for pain. Follow-up with podiatry for reevaluation this week. Return to the ED if any concerning signs or symptoms develop.

## 2016-11-19 NOTE — ED Triage Notes (Signed)
Pt tripped up stairs resulting in left toe pain; "toenail came up." Event 2 hours ago.

## 2016-11-19 NOTE — ED Provider Notes (Signed)
[facility]-EMERGENCY DEPT Provider Note   CSN: [id number] Arrival date & time: 11/19/16  1454   By signing my name below, I, Cynda Acres, attest that this documentation has been prepared under the direction and in the presence of [facility], PA-C. Electronically Signed: Cynda Acres, Scribe. 11/19/16. 4:28 PM.  History   Chief Complaint Chief Complaint  Patient presents with  . Toe Pain    HPI Comments: Lauren Diaz is a 26 y.o. female with no pertinent past medical history, who presents to the Emergency Department complaining of sudden-onset, constant left great toe pain that began two hours prior to arrival. Patient states she tripped and fell up the stairs injuring her right great toe. Patient states the entire toe nail "came up", so she attempted to remove the nail by cutting it . Patient describes her pain as constant dull aching. Patient denies any head injury, LOC, numbness, weakness, or tingling. Patient reports associated controlled bleeding. No modifying factors indicated. Last tetanus shot is unknown. Nothing improves or worsens her pain. Patient is ambulatory in the emergency department. Patient denies any fever, chills, discharge, weakness, or any additional symptoms.   The history is provided by the patient. No language interpreter was used.    Past Medical History:  Diagnosis Date  . Chlamydia   . Gonorrhea   . Preterm labor   . Trichomonas     Patient Active Problem List   Diagnosis Date Noted  . SVD (spontaneous vaginal delivery) 05/08/2014  . Post-dates pregnancy 05/07/2014  . Short cervical length during pregnancy 02/16/2014  . Preterm labor 02/15/2014    Past Surgical History:  Procedure Laterality Date  . THERAPEUTIC ABORTION      OB History    Gravida Para Term Preterm AB Living   5 2 1 1 2 2    SAB TAB Ectopic Multiple Live Births   0 2   0 2       Home Medications    Prior to Admission medications   Medication Sig Start Date End Date  Taking? Authorizing Provider  metroNIDAZOLE (FLAGYL) 500 MG tablet Take 1 tablet (500 mg total) by mouth 2 (two) times daily. Patient not taking: Reported on 10/11/2016 07/06/16   Dorathy Kinsman, CNM    Family History Family History  Problem Relation Age of Onset  . Heart disease Father   . Cancer Maternal Grandfather        prostate  . Kidney disease Paternal Grandmother   . Diabetes Maternal Grandmother   . Kidney disease Maternal Grandmother   . Cancer Maternal Grandmother   . Cancer Maternal Uncle   . Anesthesia problems Neg Hx     Social History Social History  Substance Use Topics  . Smoking status: Former Smoker    Packs/day: 0.01    Years: 1.00    Types: Cigarettes    Quit date: 09/06/2012  . Smokeless tobacco: Never Used     Comment: quit with pos preg test  . Alcohol use Yes     Comment: weekends     Allergies   Penicillins   Review of Systems Review of Systems  Constitutional: Negative for chills and fever.  Musculoskeletal: Positive for arthralgias (left toe pain). Negative for gait problem and joint swelling.  Neurological: Negative for weakness and numbness.     Physical Exam Updated Vital Signs BP 124/76 (BP Location: Left Arm)   Pulse 86   Temp 98.5 F (36.9 C) (Oral)   Resp 18   LMP 11/12/2016  SpO2 98%   Physical Exam  Constitutional: She appears well-developed and well-nourished.  HENT:  Head: Normocephalic and atraumatic.  Eyes: Pupils are equal, round, and reactive to light.  Neck: Normal range of motion. No JVD present. No tracheal deviation present.  Cardiovascular: Normal rate and intact distal pulses.   2+ DP/PT pulses bilaterally  Pulmonary/Chest: Effort normal.  Musculoskeletal: Normal range of motion. She exhibits tenderness. She exhibits no edema or deformity.  Mild tenderness to palpation of the left great toe. Normal range of motion, no deformity or crepitus noted. 5/5 EHL strength. See attached images: Left great toenail  has been lifted off of the nailbed. The nail matrix is intact with no underlying laceration. Bleeding is controlled. Lateral edges of the nail appeared to have been pulled from the nailbed. No surrounding erythema or swelling.   Neurological: She is alert.  Fluent speech, no facial droop, sensation intact to light touch of bilateral feet and toes  Skin: Skin is warm and dry. Capillary refill takes less than 2 seconds.  Psychiatric: She has a normal mood and affect. Her behavior is normal.  Nursing note and vitals reviewed.        ED Treatments / Results  DIAGNOSTIC STUDIES: Oxygen Saturation is 99% on RA, normal by my interpretation.    COORDINATION OF CARE: 4:28 PM Discussed treatment plan with pt at bedside and pt agreed to plan, which includes suturing.   Labs (all labs ordered are listed, but only abnormal results are displayed) Labs Reviewed - No data to display  EKG  EKG Interpretation None       Radiology No results found.  Procedures .Nail Removal Date/Time: 11/19/2016 6:04 PM Performed by: Michela PitcherFAWZE, Boy Delamater A Authorized by: Michela PitcherFAWZE, Dyann Goodspeed A   Consent:    Consent obtained:  Verbal   Consent given by:  Patient   Risks discussed:  Bleeding, incomplete removal, pain, infection and permanent nail deformity Location:    Foot:  L big toe Pre-procedure details:    Skin preparation:  Betadine Anesthesia (see MAR for exact dosages):    Anesthesia method:  Nerve block   Block needle gauge:  25 G   Block anesthetic:  Lidocaine 2% w/o epi   Block injection procedure:  Anatomic landmarks identified, anatomic landmarks palpated, introduced needle and negative aspiration for blood   Block outcome:  Anesthesia achieved Nail Removal:    Nail removed:  Complete Trephination:    Subungual hematoma drained: no   Nails trimmed:    Number of nails trimmed:  1 Post-procedure details:    Dressing:  Xeroform gauze and 4x4 sterile gauze   Patient tolerance of procedure:  Tolerated  well, no immediate complications   (including critical care time)  Medications Ordered in ED Medications  Tdap (BOOSTRIX) injection 0.5 mL (0.5 mLs Intramuscular Given 11/19/16 1641)  lidocaine (XYLOCAINE) 2 % (with pres) injection 200 mg (200 mg Intradermal Given 11/19/16 1640)     Initial Impression / Assessment and Plan / ED Course  I have reviewed the triage vital signs and the nursing notes.  Pertinent labs & imaging results that were available during my care of the patient were reviewed by me and considered in my medical decision making (see chart for details).     Patient with complete avulsion of left great toenail which occurred earlier today. Initially tachycardic, with resolution while in the ED. Low suspicion of fracture, Dislocation, or tenderness injury of great toe. Tetanus updated today Toenail removed successfully will in the  ED, extensively irrigated and cleaned, and dressing was applied. She will follow-up with podiatry for reevaluation in the next 1-2 weeks. Discussed wound care and pain management. Discussed strict indications for return to the ED. Pt verbalized understanding of and agreement with plan and is safe for discharge home at this time.   Final Clinical Impressions(s) / ED Diagnoses   Final diagnoses:  Avulsion of toenail, initial encounter    New Prescriptions Discharge Medication List as of 11/19/2016  5:58 PM    I personally performed the services described in this documentation, which was scribed in my presence. The recorded information has been reviewed and is accurate.     Jeanie Sewer, PA-C 11/19/16 2023    Vanetta Mulders, MD 11/21/16 [phone]

## 2018-01-21 ENCOUNTER — Encounter (HOSPITAL_COMMUNITY): Payer: Self-pay | Admitting: *Deleted

## 2018-01-21 ENCOUNTER — Inpatient Hospital Stay (HOSPITAL_COMMUNITY)
Admission: AD | Admit: 2018-01-21 | Discharge: 2018-01-21 | Disposition: A | Discharge status: Home / Self Care | Payer: Federal, State, Local not specified - PPO | Attending: Obstetrics and Gynecology | Admitting: Obstetrics and Gynecology

## 2018-01-21 DIAGNOSIS — N912 Amenorrhea, unspecified: Secondary | ICD-10-CM | POA: Diagnosis present

## 2018-01-21 NOTE — MAU Note (Signed)
Lauren Diaz is a 27 y.o. at Unknown here in MAU reporting:  +HPT LMP: 10/23/17?; unsure of actual date--states around 1st week of june Pain score: denies Vaginal bleeding: denies  +nausea Vitals:   01/21/18 0830  BP: 117/60  Pulse: 83  Resp: 18  Temp: 98.2 F (36.8 C)  SpO2: 98%      Lab orders placed from triage: none

## 2018-01-21 NOTE — MAU Provider Note (Signed)
[name] is a 27 y.o. [id number] who present to MAU today for pregnancy confirmation. She denies abdominal pain or vaginal bleeding.   BP 117/60 (BP Location: Right Arm)   Pulse 83   Temp 98.2 F (36.8 C) (Oral)   Resp 18   Wt 98 kg   LMP 10/23/2017 (Approximate)   SpO2 98% Comment: ra  BMI 34.86 kg/m  CONSTITUTIONAL: Well-developed, well-nourished female in no acute distress.  CARDIOVASCULAR: Normal heart rate noted RESPIRATORY: Effort and breath sounds normal GASTROINTESTINAL:Soft, no distention noted.  No tenderness, rebound or guarding.  SKIN: Skin is warm and dry. No rash noted. Not diaphoretic. No erythema. No pallor. PSYCHIATRIC: Normal mood and affect. Normal behavior. Normal judgment and thought content.  MDM Medical screening exam complete Patient does not endorse any symptoms concerning for ectopic pregnancy or pregnancy related complication today.   A:  Amenorrhea  P: Discharge home Patient advised that she can present as a walk-in to [facility] for a pregnancy test M-Th between 8am-4pm or Friday between 8am -11am Reasons to return to MAU reviewed  Patient may return to MAU as needed or if her condition were to change or worsen  Judeth HornLawrence, Treesa Mccully, NP 01/21/2018 8:37 AM

## 2018-01-27 ENCOUNTER — Ambulatory Visit: Payer: Federal, State, Local not specified - PPO

## 2018-02-13 LAB — OB RESULTS CONSOLE RUBELLA ANTIBODY, IGM: RUBELLA: IMMUNE

## 2018-02-13 LAB — OB RESULTS CONSOLE HEPATITIS B SURFACE ANTIGEN: HEP B S AG: NEGATIVE

## 2018-02-13 LAB — OB RESULTS CONSOLE RPR: RPR: NONREACTIVE

## 2018-02-13 LAB — OB RESULTS CONSOLE HIV ANTIBODY (ROUTINE TESTING): HIV: NONREACTIVE

## 2018-02-13 LAB — OB RESULTS CONSOLE ABO/RH: RH TYPE: POSITIVE

## 2018-05-21 ENCOUNTER — Other Ambulatory Visit: Payer: Self-pay

## 2018-05-21 ENCOUNTER — Inpatient Hospital Stay (HOSPITAL_COMMUNITY)
Admission: AD | Admit: 2018-05-21 | Discharge: 2018-05-21 | Disposition: A | Discharge status: Home / Self Care | Payer: Federal, State, Local not specified - PPO | Source: Ambulatory Visit | Attending: Obstetrics and Gynecology | Admitting: Obstetrics and Gynecology

## 2018-05-21 ENCOUNTER — Encounter (HOSPITAL_COMMUNITY): Payer: Self-pay | Admitting: *Deleted

## 2018-05-21 DIAGNOSIS — Z3A27 27 weeks gestation of pregnancy: Secondary | ICD-10-CM | POA: Diagnosis not present

## 2018-05-21 DIAGNOSIS — O26892 Other specified pregnancy related conditions, second trimester: Secondary | ICD-10-CM | POA: Insufficient documentation

## 2018-05-21 DIAGNOSIS — R12 Heartburn: Secondary | ICD-10-CM | POA: Diagnosis not present

## 2018-05-21 DIAGNOSIS — R079 Chest pain, unspecified: Secondary | ICD-10-CM | POA: Diagnosis present

## 2018-05-21 DIAGNOSIS — O26899 Other specified pregnancy related conditions, unspecified trimester: Secondary | ICD-10-CM | POA: Diagnosis not present

## 2018-05-21 MED ORDER — PANTOPRAZOLE SODIUM 20 MG PO TBEC
20.0000 mg | DELAYED_RELEASE_TABLET | Freq: Once | ORAL | Status: DC
  Filled 2018-05-21: qty 1

## 2018-05-21 MED ORDER — PANTOPRAZOLE SODIUM 20 MG PO TBEC: 20.0000 mg | DELAYED_RELEASE_TABLET | Freq: Two times a day (BID) | ORAL | 0 refills | Status: AC

## 2018-05-21 MED ORDER — ALUM & MAG HYDROXIDE-SIMETH 200-200-20 MG/5ML PO SUSP
30.0000 mL | Freq: Once | ORAL | Status: AC
  Administered 2018-05-21: 30 mL via ORAL
  Filled 2018-05-21: qty 30

## 2018-05-21 MED ORDER — LIDOCAINE VISCOUS HCL 2 % MT SOLN
15.0000 mL | Freq: Once | OROMUCOSAL | Status: AC
  Administered 2018-05-21: 15 mL via ORAL
  Filled 2018-05-21: qty 15

## 2018-05-21 NOTE — MAU Note (Signed)
Having chest pain since 2330 SAt night. Tried to sleep but could not. Pain is like pressure in mid and upper chest. Occ cough but no congestion. No fever or chills. Thinks had virus Friday - threwup once but feeling better Sat. Currently using cream for  for yeast, Trich, and BV.

## 2018-05-21 NOTE — Discharge Instructions (Signed)

## 2018-05-21 NOTE — Progress Notes (Signed)
Ma Hillock Neill CNM notified of pt's admission and status. EKG ordered and will see pt.

## 2018-05-21 NOTE — MAU Provider Note (Addendum)
History     CSN: [id number]  Arrival date and time: 05/21/18 0449   First Provider Initiated Contact with Patient 05/21/18 0535      Chief Complaint  Patient presents with  . Chest Pain   HPI Lauren Diaz is a 27 y.o. [id number] at 5062w3d who presents with chest pain. She states when she went to bed at 2330, she felt pain in the middle of her chest that has continued throughout the night. She tried drinking water with no relief. She states she ate a baked potato with sour cream at 2300 before she went to bed. She denies any abdominal pain, vaginal bleeding or leaking of fluid. Reports normal fetal movement.   OB History    Gravida  6   Para  2   Term  1   Preterm  1   AB  2   Living  2     SAB  0   TAB  2   Ectopic      Multiple  0   Live Births  2           Past Medical History:  Diagnosis Date  . Chlamydia   . Gonorrhea   . Preterm labor   . Trichomonas     Past Surgical History:  Procedure Laterality Date  . THERAPEUTIC ABORTION      Family History  Problem Relation Age of Onset  . Heart disease Father   . Cancer Maternal Grandfather        prostate  . Kidney disease Paternal Grandmother   . Diabetes Maternal Grandmother   . Kidney disease Maternal Grandmother   . Cancer Maternal Grandmother   . Cancer Maternal Uncle   . Anesthesia problems Neg Hx     Social History   Tobacco Use  . Smoking status: Former Smoker    Packs/day: 0.01    Years: 1.00    Pack years: 0.01    Types: Cigarettes    Last attempt to quit: 09/06/2012    Years since quitting: 5.7  . Smokeless tobacco: Never Used  . Tobacco comment: quit with pos preg test  Substance Use Topics  . Alcohol use: Yes    Comment: weekends  . Drug use: No    Allergies:  Allergies  Allergen Reactions  . Penicillins Rash and Other (See Comments)    Has patient had a PCN reaction causing immediate rash, facial/tongue/throat swelling, SOB or lightheadedness with hypotension:  No Has patient had a PCN reaction causing severe rash involving mucus membranes or skin necrosis: No Has patient had a PCN reaction that required hospitalization No Has patient had a PCN reaction occurring within the last 10 years: No If all of the above answers are "NO", then may proceed with Cephalosporin use.   Childhood reaction    No medications prior to admission.    Review of Systems  Constitutional: Negative.  Negative for fatigue and fever.  HENT: Negative.   Respiratory: Negative.  Negative for shortness of breath.   Cardiovascular: Positive for chest pain.  Gastrointestinal: Negative.  Negative for abdominal pain, constipation, diarrhea, nausea and vomiting.  Genitourinary: Negative.  Negative for dysuria, vaginal bleeding and vaginal discharge.  Neurological: Negative.  Negative for dizziness and headaches.   Physical Exam   Blood pressure (!) 104/50, pulse 96, resp. rate 18, height 5\' 6"  (1.676 m), weight 96.2 kg, last menstrual period 10/23/2017, SpO2 98 %, unknown if currently breastfeeding.  Physical Exam  Nursing note and  vitals reviewed. Constitutional: She is oriented to person, place, and time. She appears well-developed and well-nourished. No distress.  HENT:  Head: Normocephalic.  Eyes: Pupils are equal, round, and reactive to light.  Cardiovascular: Normal rate, regular rhythm and normal heart sounds.  No murmur heard. Respiratory: Effort normal and breath sounds normal. No respiratory distress. She has no wheezes. She has no rales. She exhibits no tenderness.  GI: Soft. Bowel sounds are normal. She exhibits no distension. There is no abdominal tenderness.  Neurological: She is alert and oriented to person, place, and time.  Skin: Skin is warm and dry.  Psychiatric: She has a normal mood and affect. Her behavior is normal. Judgment and thought content normal.   Fetal Tracing:  Baseline: 150 Variability: moderate Accels: 10x10 Decels:  variable  Toco: none  MAU Course  Procedures  MDM Prenatal records from private office reviewed. Pregnancy complicated by chromosomal abnormality being followed by MFM. Labs ordered and reviewed.  ED EKG- normal sinus rhythm, normal EKG GI cocktail- patient reports significant improvement of pain NST reassuring for gestational age  Assessment and Plan   1. Heartburn during pregnancy, antepartum   2. [redacted] weeks gestation of pregnancy    -Discharge home in stable condition -Rx for protonix sent to patient's pharmacy -GERD precautions discussed -Patient advised to follow-up with [facility] as scheduled on Tuesday -Patient may return to MAU as needed or if her condition were to change or worsen  Rolm BookbinderCaroline M Mailey Landstrom CNM 05/21/2018, 5:36 AM

## 2018-05-24 NOTE — L&D Delivery Note (Addendum)
Delivery Note At 4:11 PM a viable female was delivered via Vaginal, Presentation DOP Apgars pending Weight pending Placenta L&D Cord PH not sent  Complications fetus with known chromosomal anomaly, SGA likely NICU at bedside s/s this.   Anesthesia:  CLE Episiotomy: None Lacerations: None Suture Repair: 3.0 vicryl Est. Blood Loss (mL): 200   It's a girl - "Alayna"!!   Mom to postpartum.  Baby to Couplet care / Skin to Skin.  Ranae Pila 08/04/2018, 4:28 PM

## 2018-06-23 ENCOUNTER — Encounter (HOSPITAL_COMMUNITY): Payer: Self-pay

## 2018-06-23 NOTE — Progress Notes (Signed)
Neonatal consult scheduled for July 05, 2018 at 1:00 with level II Neonatologist. Patient is to come to NICU for consult. Questions answered.

## 2018-07-05 ENCOUNTER — Encounter (HOSPITAL_COMMUNITY): Payer: Self-pay | Admitting: Neonatology

## 2018-07-05 NOTE — Progress Notes (Signed)
Neonatology consultation  Asked by Dr. Lynnette Caffey to meet with Ms. 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  I met with her in the NICU conference room and we discussed likely need for NICU admission due to IUGR and probable feeding difficulties.  Discussed postnatal evaluation for congenital heart defects and concerns for immune deficiency leading to respiratory infections and long-term problems with cognitive function/intellectual disability.  I explained usual procedure for high risk delivery, with presence of neonatal team for immediate stabilization (resuscitation if needed) and admission to NICU if needed for respiratory support, IV fluids, NG feedings, and/or  temp support (incubator). She expects FOB to be present at delivery and I told her he would be expected to accompany infant to NICU. Also told her that she and FOB would be involved in any decisions about ongoing care in the event she has life-threatening complications or requires long-term ventilator support, surgery, or other "heroic" interventions.  I told her about our preference to feed her milk (vs formula) if possible and she plans to breast feed.  She has a 26 yo son who was premature and she says was a patient in our NICU for about 2 weeks.  Patient had appropriate questions and expressed appreciation of my input. She inquired about the move to [facility] and I encouraged her to attend the open house for general public this Saturday (Feb 15). Referred her to the [facility] website for more information.  Thank you for consulting Neonatology.  Lenton Gendreau E. Burney Gauze., MD Neonatologist  Total time 45 minutes - about 30 minutes face-to-face).

## 2018-07-21 LAB — OB RESULTS CONSOLE GBS: GBS: NEGATIVE

## 2018-07-31 ENCOUNTER — Encounter (HOSPITAL_COMMUNITY): Payer: Self-pay | Admitting: *Deleted

## 2018-07-31 ENCOUNTER — Telehealth (HOSPITAL_COMMUNITY): Payer: Self-pay | Admitting: *Deleted

## 2018-07-31 NOTE — Telephone Encounter (Signed)
Preadmission screen  

## 2018-08-01 ENCOUNTER — Encounter (HOSPITAL_COMMUNITY): Payer: Self-pay | Admitting: *Deleted

## 2018-08-02 ENCOUNTER — Other Ambulatory Visit: Payer: Self-pay | Admitting: Obstetrics and Gynecology

## 2018-08-02 DIAGNOSIS — Z3A38 38 weeks gestation of pregnancy: Secondary | ICD-10-CM

## 2018-08-02 NOTE — H&P (Signed)
Lauren Diaz is a 28 y.o. female presenting for IOL. Her pregnancy is c/b a chromosomal abnormality. 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 She has been followed by serial [place] and growth has been lagging. Last [place] 05/2018 with EFW <10%ile and AC <3%ile and normal UAD. Fetus with known intracranial cyst as well.  MFM recommended delivery at 38 weeks.  She has a previous hx of PPROM and PTD with first pregnancy followed by term delivery and has been on 17OPH this pregnancy.    OB History    Gravida  6   Para  2   Term  1   Preterm  1   AB  3   Living  2     SAB  1   TAB  2   Ectopic      Multiple  0   Live Births  2          Past Medical History:  Diagnosis Date  . Chlamydia   . Gonorrhea   . Preterm labor   . Trichomonas    Past Surgical History:  Procedure Laterality Date  . THERAPEUTIC ABORTION     Family History: family history includes Cancer in her maternal grandfather, maternal grandmother, and maternal uncle; Colon cancer in her father; Diabetes in her maternal grandmother; Heart disease in her father; Hypertension in her maternal aunt; Kidney disease in her paternal grandmother; Thyroid disease in her maternal grandmother and mother. Social History:  reports that she quit smoking about 5 years ago. Her smoking use included cigarettes. She has a 0.01 pack-year smoking history. She has never used smokeless tobacco. She reports current alcohol use. She reports that she does not use drugs.     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 Cervix is unfavorable.  Plan: cytotec followed by pitocin/AROM when cervix more favorable NICU at delivery.  Genetics team at [facility] to communicate with [facility], specifically Dr. Erik Obey.  GBS neg   Lauren Diaz 08/02/2018, 1:37 PM

## 2018-08-03 ENCOUNTER — Other Ambulatory Visit (HOSPITAL_COMMUNITY): Payer: Self-pay | Admitting: *Deleted

## 2018-08-04 ENCOUNTER — Other Ambulatory Visit: Payer: Self-pay

## 2018-08-04 ENCOUNTER — Inpatient Hospital Stay (HOSPITAL_COMMUNITY): Payer: Federal, State, Local not specified - PPO | Admitting: Anesthesiology

## 2018-08-04 ENCOUNTER — Inpatient Hospital Stay (HOSPITAL_COMMUNITY)
Admission: AD | Admit: 2018-08-04 | Discharge: 2018-08-06 | DRG: 807 | Disposition: A | Discharge status: Home / Self Care | Payer: Federal, State, Local not specified - PPO | Attending: Obstetrics and Gynecology | Admitting: Obstetrics and Gynecology

## 2018-08-04 ENCOUNTER — Encounter (HOSPITAL_COMMUNITY): Payer: Self-pay

## 2018-08-04 ENCOUNTER — Inpatient Hospital Stay (HOSPITAL_COMMUNITY): Payer: Federal, State, Local not specified - PPO

## 2018-08-04 DIAGNOSIS — Z87891 Personal history of nicotine dependence: Secondary | ICD-10-CM

## 2018-08-04 DIAGNOSIS — O365930 Maternal care for other known or suspected poor fetal growth, third trimester, not applicable or unspecified: Secondary | ICD-10-CM | POA: Diagnosis present

## 2018-08-04 DIAGNOSIS — O351XX0 Maternal care for (suspected) chromosomal abnormality in fetus, not applicable or unspecified: Secondary | ICD-10-CM | POA: Diagnosis present

## 2018-08-04 DIAGNOSIS — Z3A38 38 weeks gestation of pregnancy: Secondary | ICD-10-CM

## 2018-08-04 DIAGNOSIS — O283 Abnormal ultrasonic finding on antenatal screening of mother: Secondary | ICD-10-CM

## 2018-08-04 DIAGNOSIS — Z3490 Encounter for supervision of normal pregnancy, unspecified, unspecified trimester: Secondary | ICD-10-CM

## 2018-08-04 LAB — CBC
HCT: 34.5 % — ABNORMAL LOW (ref 36.0–46.0)
Hemoglobin: 11.2 g/dL — ABNORMAL LOW (ref 12.0–15.0)
MCH: 29.8 pg (ref 26.0–34.0)
MCHC: 32.5 g/dL (ref 30.0–36.0)
MCV: 91.8 fL (ref 80.0–100.0)
Platelets: 250 10*3/uL (ref 150–400)
RBC: 3.76 MIL/uL — ABNORMAL LOW (ref 3.87–5.11)
RDW: 13.2 % (ref 11.5–15.5)
WBC: 11.3 10*3/uL — ABNORMAL HIGH (ref 4.0–10.5)
nRBC: 0 % (ref 0.0–0.2)

## 2018-08-04 LAB — TYPE AND SCREEN
ABO/RH(D): O POS
Antibody Screen: NEGATIVE

## 2018-08-04 LAB — ABO/RH: ABO/RH(D): O POS

## 2018-08-04 LAB — RPR: RPR: NONREACTIVE

## 2018-08-04 MED ORDER — LACTATED RINGERS IV SOLN
INTRAVENOUS | Status: DC
  Administered 2018-08-04 (×4): via INTRAVENOUS

## 2018-08-04 MED ORDER — PHENYLEPHRINE 40 MCG/ML (10ML) SYRINGE FOR IV PUSH (FOR BLOOD PRESSURE SUPPORT)
80.0000 ug | PREFILLED_SYRINGE | INTRAVENOUS | Status: DC | PRN
  Filled 2018-08-04: qty 10

## 2018-08-04 MED ORDER — OXYTOCIN 40 UNITS IN NORMAL SALINE INFUSION - SIMPLE MED: 2.5000 [IU]/h | INTRAVENOUS | Status: DC

## 2018-08-04 MED ORDER — WITCH HAZEL-GLYCERIN EX PADS: 1.0000 "application " | MEDICATED_PAD | CUTANEOUS | Status: DC | PRN

## 2018-08-04 MED ORDER — SODIUM CHLORIDE (PF) 0.9 % IJ SOLN
INTRAMUSCULAR | Status: DC | PRN
  Administered 2018-08-04: 12 mL/h via EPIDURAL

## 2018-08-04 MED ORDER — MISOPROSTOL 25 MCG QUARTER TABLET
25.0000 ug | ORAL_TABLET | ORAL | Status: DC | PRN
  Administered 2018-08-04: 25 ug via VAGINAL
  Filled 2018-08-04 (×2): qty 1

## 2018-08-04 MED ORDER — SENNOSIDES-DOCUSATE SODIUM 8.6-50 MG PO TABS
2.0000 | ORAL_TABLET | ORAL | Status: DC
  Administered 2018-08-04 – 2018-08-05 (×2): 2 via ORAL
  Filled 2018-08-04 (×2): qty 2

## 2018-08-04 MED ORDER — PHENYLEPHRINE 40 MCG/ML (10ML) SYRINGE FOR IV PUSH (FOR BLOOD PRESSURE SUPPORT)
PREFILLED_SYRINGE | INTRAVENOUS | Status: AC
  Filled 2018-08-04: qty 10

## 2018-08-04 MED ORDER — SIMETHICONE 80 MG PO CHEW: 80.0000 mg | CHEWABLE_TABLET | ORAL | Status: DC | PRN

## 2018-08-04 MED ORDER — DIPHENHYDRAMINE HCL 50 MG/ML IJ SOLN: 12.5000 mg | INTRAMUSCULAR | Status: DC | PRN

## 2018-08-04 MED ORDER — ONDANSETRON HCL 4 MG/2ML IJ SOLN: 4.0000 mg | INTRAMUSCULAR | Status: DC | PRN

## 2018-08-04 MED ORDER — ACETAMINOPHEN 325 MG PO TABS: 650.0000 mg | ORAL_TABLET | ORAL | Status: DC | PRN

## 2018-08-04 MED ORDER — BUTORPHANOL TARTRATE 1 MG/ML IJ SOLN: 1.0000 mg | INTRAMUSCULAR | Status: DC | PRN

## 2018-08-04 MED ORDER — OXYTOCIN 40 UNITS IN NORMAL SALINE INFUSION - SIMPLE MED
1.0000 m[IU]/min | INTRAVENOUS | Status: DC
  Administered 2018-08-04: 2 m[IU]/min via INTRAVENOUS
  Filled 2018-08-04: qty 1000

## 2018-08-04 MED ORDER — IBUPROFEN 600 MG PO TABS
600.0000 mg | ORAL_TABLET | Freq: Four times a day (QID) | ORAL | Status: DC
  Administered 2018-08-04 – 2018-08-06 (×7): 600 mg via ORAL
  Filled 2018-08-04 (×7): qty 1

## 2018-08-04 MED ORDER — OXYCODONE-ACETAMINOPHEN 5-325 MG PO TABS: 2.0000 | ORAL_TABLET | ORAL | Status: DC | PRN

## 2018-08-04 MED ORDER — OXYCODONE-ACETAMINOPHEN 5-325 MG PO TABS: 1.0000 | ORAL_TABLET | ORAL | Status: DC | PRN

## 2018-08-04 MED ORDER — LACTATED RINGERS IV SOLN: 500.0000 mL | Freq: Once | INTRAVENOUS | Status: DC

## 2018-08-04 MED ORDER — OXYCODONE HCL 5 MG PO TABS: 5.0000 mg | ORAL_TABLET | ORAL | Status: DC | PRN

## 2018-08-04 MED ORDER — ONDANSETRON HCL 4 MG PO TABS: 4.0000 mg | ORAL_TABLET | ORAL | Status: DC | PRN

## 2018-08-04 MED ORDER — ZOLPIDEM TARTRATE 5 MG PO TABS: 5.0000 mg | ORAL_TABLET | Freq: Every evening | ORAL | Status: DC | PRN

## 2018-08-04 MED ORDER — OXYTOCIN BOLUS FROM INFUSION
500.0000 mL | Freq: Once | INTRAVENOUS | Status: AC
  Administered 2018-08-04: 500 mL via INTRAVENOUS

## 2018-08-04 MED ORDER — EPHEDRINE 5 MG/ML INJ
10.0000 mg | INTRAVENOUS | Status: DC | PRN
  Filled 2018-08-04: qty 2
  Filled 2018-08-04: qty 10

## 2018-08-04 MED ORDER — TERBUTALINE SULFATE 1 MG/ML IJ SOLN: 0.2500 mg | Freq: Once | INTRAMUSCULAR | Status: DC | PRN

## 2018-08-04 MED ORDER — BENZOCAINE-MENTHOL 20-0.5 % EX AERO
1.0000 "application " | INHALATION_SPRAY | CUTANEOUS | Status: DC | PRN
  Administered 2018-08-04: 1 via TOPICAL
  Filled 2018-08-04: qty 56

## 2018-08-04 MED ORDER — OXYCODONE HCL 5 MG PO TABS: 10.0000 mg | ORAL_TABLET | ORAL | Status: DC | PRN

## 2018-08-04 MED ORDER — LACTATED RINGERS IV SOLN
500.0000 mL | INTRAVENOUS | Status: DC | PRN
  Administered 2018-08-04: 500 mL via INTRAVENOUS

## 2018-08-04 MED ORDER — DIPHENHYDRAMINE HCL 25 MG PO CAPS: 25.0000 mg | ORAL_CAPSULE | Freq: Four times a day (QID) | ORAL | Status: DC | PRN

## 2018-08-04 MED ORDER — EPHEDRINE 5 MG/ML INJ
INTRAVENOUS | Status: AC
  Filled 2018-08-04: qty 10

## 2018-08-04 MED ORDER — TETANUS-DIPHTH-ACELL PERTUSSIS 5-2.5-18.5 LF-MCG/0.5 IM SUSP: 0.5000 mL | Freq: Once | INTRAMUSCULAR | Status: DC

## 2018-08-04 MED ORDER — ONDANSETRON HCL 4 MG/2ML IJ SOLN: 4.0000 mg | Freq: Four times a day (QID) | INTRAMUSCULAR | Status: DC | PRN

## 2018-08-04 MED ORDER — COCONUT OIL OIL
1.0000 "application " | TOPICAL_OIL | Status: DC | PRN
  Administered 2018-08-04: 1 via TOPICAL

## 2018-08-04 MED ORDER — SOD CITRATE-CITRIC ACID 500-334 MG/5ML PO SOLN: 30.0000 mL | ORAL | Status: DC | PRN

## 2018-08-04 MED ORDER — LIDOCAINE-EPINEPHRINE (PF) 2 %-1:200000 IJ SOLN
INTRAMUSCULAR | Status: DC | PRN
  Administered 2018-08-04: 3 mL via EPIDURAL
  Administered 2018-08-04: 4 mL via EPIDURAL

## 2018-08-04 MED ORDER — DIBUCAINE 1 % RE OINT: 1.0000 "application " | TOPICAL_OINTMENT | RECTAL | Status: DC | PRN

## 2018-08-04 MED ORDER — PRENATAL MULTIVITAMIN CH
1.0000 | ORAL_TABLET | Freq: Every day | ORAL | Status: DC
  Administered 2018-08-05: 1 via ORAL
  Filled 2018-08-04: qty 1

## 2018-08-04 MED ORDER — FENTANYL-BUPIVACAINE-NACL 0.5-0.125-0.9 MG/250ML-% EP SOLN
12.0000 mL/h | EPIDURAL | Status: DC | PRN
  Filled 2018-08-04: qty 250

## 2018-08-04 MED ORDER — LIDOCAINE HCL (PF) 1 % IJ SOLN: 30.0000 mL | INTRAMUSCULAR | Status: DC | PRN

## 2018-08-04 MED ORDER — EPHEDRINE 5 MG/ML INJ
10.0000 mg | INTRAVENOUS | Status: DC | PRN
  Administered 2018-08-04: 10 mg via INTRAVENOUS
  Filled 2018-08-04: qty 2

## 2018-08-04 NOTE — Anesthesia Procedure Notes (Signed)
Epidural Patient location during procedure: OB Start time: 08/04/2018 8:15 AM End time: 08/04/2018 8:30 AM  Staffing Anesthesiologist: Elmer Picker, MD Performed: anesthesiologist   Preanesthetic Checklist Completed: patient identified, pre-op evaluation, timeout performed, IV checked, risks and benefits discussed and monitors and equipment checked  Epidural Patient position: sitting Prep: site prepped and draped and DuraPrep Patient monitoring: continuous pulse ox, blood pressure, heart rate and cardiac monitor Approach: midline Location: L3-L4 Injection technique: LOR air  Needle:  Needle type: Tuohy  Needle gauge: 17 G Needle length: 9 cm Needle insertion depth: 7 cm Catheter type: closed end flexible Catheter size: 19 Gauge Catheter at skin depth: 12 cm Test dose: negative  Assessment Sensory level: T8 Events: blood not aspirated, injection not painful, no injection resistance, negative IV test and no paresthesia  Additional Notes Patient identified. Risks/Benefits/Options discussed with patient including but not limited to bleeding, infection, nerve damage, paralysis, failed block, incomplete pain control, headache, blood pressure changes, nausea, vomiting, reactions to medication both or allergic, itching and postpartum back pain. Confirmed with bedside nurse the patient's most recent platelet count. Confirmed with patient that they are not currently taking any anticoagulation, have any bleeding history or any family history of bleeding disorders. Patient expressed understanding and wished to proceed. All questions were answered. Sterile technique was used throughout the entire procedure. Please see nursing notes for vital signs. Test dose was given through epidural catheter and negative prior to continuing to dose epidural or start infusion. Warning signs of high block given to the patient including shortness of breath, tingling/numbness in hands, complete motor block,  or any concerning symptoms with instructions to call for help. Patient was given instructions on fall risk and not to get out of bed. All questions and concerns addressed with instructions to call with any issues or inadequate analgesia.  Reason for block:procedure for pain

## 2018-08-04 NOTE — Progress Notes (Signed)
FHT now cat 1, continue pitocin titration SVE 3/5-4/50/-2, posterior

## 2018-08-04 NOTE — Progress Notes (Signed)
6/c/-1 Continue pitocin titration

## 2018-08-04 NOTE — Anesthesia Pain Management Evaluation Note (Signed)
  [facility] Pain Management Visit Note  Patient: Lauren Diaz, 28 y.o., female  "Hello I am a member of the anesthesia team at [facility]. We have an anesthesia team available at all times to provide care throughout the hospital, including epidural management and anesthesia for C-section. I don't know your plan for the delivery whether it a natural birth, water birth, IV sedation, nitrous supplementation, doula or epidural, but we want to meet your pain goals."   1.Was your pain managed to your expectations on prior hospitalizations?   Yes   2.What is your expectation for pain management during this hospitalization?     Epidural  3.How can we help you reach that goal? Would like epidural now  Record the patient's initial score and the patient's pain goal.   Pain: 6  Pain Goal: 6 [facility] wants you to be able to say your pain was always managed very well.  Rica Records 08/04/2018

## 2018-08-04 NOTE — Progress Notes (Signed)
SVE 2/50/-2, AROM for clear fluid. IUPC placed gvien occasional decels - unable to titrate pitocin given unsure if earlys/lates/variables.  Pt planning on CLE - encouraged early CLE. CTM closely.

## 2018-08-04 NOTE — Progress Notes (Signed)
No c/o. SVE 5/70/-2 Cont pitocin Rosie Fate MD

## 2018-08-04 NOTE — Anesthesia Preprocedure Evaluation (Signed)
Anesthesia Evaluation  Patient identified by MRN, date of birth, ID band Patient awake    Reviewed: Allergy & Precautions, NPO status , Patient's Chart, lab work & pertinent test results  Airway Mallampati: II  TM Distance: >3 FB Neck ROM: Full    Dental no notable dental hx.    Pulmonary neg pulmonary ROS, former smoker,    Pulmonary exam normal breath sounds clear to auscultation       Cardiovascular negative cardio ROS Normal cardiovascular exam Rhythm:Regular Rate:Normal     Neuro/Psych negative neurological ROS  negative psych ROS   GI/Hepatic negative GI ROS, Neg liver ROS,   Endo/Other  negative endocrine ROS  Renal/GU negative Renal ROS  negative genitourinary   Musculoskeletal negative musculoskeletal ROS (+)   Abdominal   Peds negative pediatric ROS (+)  Hematology negative hematology ROS (+)   Anesthesia Other Findings   Reproductive/Obstetrics (+) Pregnancy                             Anesthesia Physical Anesthesia Plan  ASA: II  Anesthesia Plan: Epidural   Post-op Pain Management:    Induction:   PONV Risk Score and Plan: Treatment may vary due to age or medical condition  Airway Management Planned: Natural Airway  Additional Equipment:   Intra-op Plan:   Post-operative Plan:   Informed Consent: I have reviewed the patients History and Physical, chart, labs and discussed the procedure including the risks, benefits and alternatives for the proposed anesthesia with the patient or authorized representative who has indicated his/her understanding and acceptance.       Plan Discussed with: Anesthesiologist  Anesthesia Plan Comments: (Patient identified. Risks, benefits, options discussed with patient including but not limited to bleeding, infection, nerve damage, paralysis, failed block, incomplete pain control, headache, blood pressure changes, nausea,  vomiting, reactions to medication, itching, and post partum back pain. Confirmed with bedside nurse the patient's most recent platelet count. Confirmed with the patient that they are not taking any anticoagulation, have any bleeding history or any family history of bleeding disorders. Patient expressed understanding and wishes to proceed. All questions were answered. )        Anesthesia Quick Evaluation  

## 2018-08-04 NOTE — H&P (Signed)
Lauren Diaz is a 28 y.o. female presenting for IOL. Her pregnancy is c/b a chromosomal abnormality. 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 She has been followed by serial [place] and growth has been lagging. Last [place] 05/2018 with EFW <10%ile and AC <3%ile and normal UAD. Fetus with known intracranial cyst as well.  MFM recommended delivery at 38 weeks.  She has a previous hx of PPROM and PTD with first pregnancy followed by term delivery and has been on 17OPH this pregnancy.    OB History    Gravida  6   Para  2   Term  1   Preterm  1   AB  3   Living  2     SAB  1   TAB  2   Ectopic      Multiple  0   Live Births  2          Past Medical History:  Diagnosis Date  . Chlamydia   . Gonorrhea   . Preterm labor   . Trichomonas    Past Surgical History:  Procedure Laterality Date  . THERAPEUTIC ABORTION     Family History: family history includes Cancer in her maternal grandfather, maternal grandmother, and maternal uncle; Colon cancer in her father; Diabetes in her maternal grandmother; Heart disease in her father; Hypertension in her maternal aunt; Kidney disease in her paternal grandmother; Thyroid disease in her maternal grandmother and mother. Social History:  reports that she quit smoking about 5 years ago. Her smoking use included cigarettes. She has a 0.01 pack-year smoking history. She has never used smokeless tobacco. She reports previous alcohol use. She reports that she does not use drugs.     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 rate 17, height 5\' 6"  (1.676 m), weight 100.2 kg, last menstrual period 10/23/2017, SpO2 99 %, unknown if currently breastfeeding. Exam Physical Exam  (from office) NAD, A&O NWOB Abd soft, nondistended, gravid SVE closed, vertex  Prenatal labs: ABO, Rh: --/--/O POS, O POS Performed at [facility], 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 Cervix is unfavorable.  Plan: cytotec followed by pitocin/AROM when cervix more favorable NICU at delivery.  Genetics team at [facility] to communicate with womens, specifically Dr. Erik Obey.  GBS neg   Ranae Pila 08/04/2018, 12:22 PM

## 2018-08-05 LAB — CBC
HCT: 28.5 % — ABNORMAL LOW (ref 36.0–46.0)
Hemoglobin: 9.3 g/dL — ABNORMAL LOW (ref 12.0–15.0)
MCH: 29.9 pg (ref 26.0–34.0)
MCHC: 32.6 g/dL (ref 30.0–36.0)
MCV: 91.6 fL (ref 80.0–100.0)
Platelets: 184 10*3/uL (ref 150–400)
RBC: 3.11 MIL/uL — ABNORMAL LOW (ref 3.87–5.11)
RDW: 13.4 % (ref 11.5–15.5)
WBC: 16.9 10*3/uL — ABNORMAL HIGH (ref 4.0–10.5)
nRBC: 0 % (ref 0.0–0.2)

## 2018-08-05 NOTE — Anesthesia Postprocedure Evaluation (Signed)
Anesthesia Post Note  Patient: Chiropodist  Procedure(s) Performed: AN AD HOC LABOR EPIDURAL     Patient location during evaluation: OB High Risk Anesthesia Type: Epidural Level of consciousness: awake and alert Pain management: pain level controlled Vital Signs Assessment: post-procedure vital signs reviewed and stable Respiratory status: spontaneous breathing, nonlabored ventilation and respiratory function stable Cardiovascular status: stable Postop Assessment: no headache, no backache, epidural receding, no apparent nausea or vomiting and patient able to bend at knees Anesthetic complications: no    Last Vitals:  Vitals:   08/04/18 2333 08/05/18 0334  BP: (!) 90/50 (!) 99/50  Pulse: 96 90  Resp: 18 18  Temp: 37 C 36.9 C  SpO2: 99% 100%    Last Pain:  Vitals:   08/05/18 0500  TempSrc:   PainSc: 0-No pain   Pain Goal:                   Rica Records

## 2018-08-05 NOTE — Lactation Note (Signed)
This note was copied from a baby's chart. 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 Baby has [employer] syndrome which involves ankyloglossia and a cleft palate. Mom started pumping yesterday and she's been getting some drops and taking them to her baby. She was set up with a DEBP yesterday and has been pumping every 3 hours, but not using coconut oil consistently prior pumping. Mom participated in the [facility] program at the [facility] and she's already familiar with hand expression, and has been able to get some colostrum. Mom has a DEBP at home.  Mom was on the fence about donor milk. She asked LC the benefits of donor milk Vs formula, LC explained to her. She said she's probably going to do donor milk now, reviewed pumping schedule and milk storage guidelines for NICU babies. Mom understands that due to baby's status feedings at the breast will be challenging and that she can always ask for lactation help once baby is ready to feed at the breast.  Feeding plan:  1. Encouraged mom to keep pumping every 3 hours and at least once at night 2. She'll use coconut oil prior pumping 3. She'll continue taking her EBM to the NICU  BF brochure, BF resources and pumping log were reviewed. Mom reported all questions and concerns were answered, she's aware of LC services and will call PRN.    Maternal Data Formula Feeding for Exclusion: No Has patient been taught Hand Expression?: Yes Does the patient have breastfeeding experience prior to this delivery?: Yes  Feeding    LATCH Score                   Interventions Interventions: Breast feeding basics reviewed;Coconut oil  Lactation Tools  Discussed/Used Tools: Coconut oil;Pump Breast pump type: Double-Electric Breast Pump WIC Program: Yes Pump Review: Setup, frequency, and cleaning;Milk Storage Initiated by:: RN Date initiated:: 08/04/18   Consult Status Consult Status: PRN Date: 08/06/18 Follow-up type: In-patient    Fayne Mcguffee Venetia Constable 08/05/2018, 12:25 PM

## 2018-08-05 NOTE — Progress Notes (Signed)
Post Partum Day 1 Subjective: no complaints, up ad lib, voiding and tolerating PO  Objective: Blood pressure (!) 99/50, pulse 90, temperature 98.5 F (36.9 C), temperature source Oral, resp. rate 18, height 5\' 6"  (1.676 m), weight 100.2 kg, last menstrual period 10/23/2017, SpO2 100 %, unknown if currently breastfeeding.  Physical Exam:  General: alert, cooperative and appears stated age Lochia: appropriate Uterine Fundus: firm Incision: N/A DVT Evaluation: No evidence of DVT seen on physical exam. Negative Homan's sign. No cords or calf tenderness. No significant calf/ankle edema.  Recent Labs    08/04/18 0037 08/05/18 0608  HGB 11.2* 9.3*  HCT 34.5* 28.5*    Assessment/Plan: Plan for discharge tomorrow Baby girl in NICU doing well.    LOS: 1 day   Ranae Pila 08/05/2018, 8:33 AM

## 2018-08-06 NOTE — Progress Notes (Signed)
Chaplain provided AD education and materials to patient and partner.  Pt. Advised to call Spiritual Care during business hours if she would like to complete AD. Pt understood and expressed appreciation for information.   Theodoro Parma [phone]    08/06/18 1200  Clinical Encounter Type  Visited With Patient and family together  Visit Type Initial (AD)  Referral From Patient  Consult/Referral To Chaplain  Stress Factors  Patient Stress Factors Not reviewed

## 2018-08-06 NOTE — Discharge Summary (Signed)
Obstetric Discharge Summary Reason for Admission: induction of labor s/s IUGR. 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 Negative Homan's sign. No cords or calf tenderness. No significant calf/ankle edema.  Discharge Diagnoses: Term Pregnancy-delivered  Discharge Information: Date: 08/06/2018 Activity: pelvic rest Diet: routine Medications: PNV Condition: stable Instructions: refer to practice specific booklet Discharge to: home   Newborn Data: Live born female  Birth Weight: 3 lb 12.3 oz (1710 g) APGAR: 8, 8  Newborn Delivery   Birth date/time:  08/04/2018 16:11:00 Delivery type:  Vaginal, Spontaneous     Home with NICU.  Lauren Diaz 08/06/2018, 8:06 AM

## 2018-08-06 NOTE — Lactation Note (Signed)
This note was copied from a baby's chart. Lactation Consultation Note  Patient Name: Lauren Diaz Today's Date: 08/06/2018  baby Lauren Moesch now 56 hours old.  Mom reports she slept a lot yesterday and has not pumped since 8 pm last night.  Mom reports also fell asleep in the NICU.  Reviewed and urged frequent pumping.Urged massage and hand expression to help get more milk.  Urged parents to watch [employer] Expression Hands on Pumping Video. Urged dad to help with massage and pumping when he is there.  Mom not sure about flange fit.  Reviewed appropriate flange fit with mom and dad.  Urged mom to call lactation and have pumping observed. Patient reports now using coconut oil and pumping is more comfortable.  Mom reports she is being discharged today.  Mom reports she has a breastpump at home sent to her by her insurance company.  Mom reports she does not know the brand but thinks it is DEBP. Urged mom to pump at babies  Bedside.  Urged mom to pick pumping back up to pump 8-12 times day.  Mom has NICU booklet with pumping log. Urged mom to pump at baby's bedside in NICU as much as possible and take all of her pump parts home when d/c.  Discussed sterilizing DEBP parts once a day in addition to washing them with hot soapy water.  Mom has Lactation Pharmacist, hospital for home use as needed.   Maternal Data    Feeding Feeding Type: Donor Breast Milk  LATCH Score                   Interventions    Lactation Tools Discussed/Used     Consult Status      Briseis Aguilera Michaelle Copas 08/06/2018, 12:19 PM

## 2019-08-24 ENCOUNTER — Ambulatory Visit: Payer: Medicaid Other | Attending: Internal Medicine

## 2019-08-24 DIAGNOSIS — Z20822 Contact with and (suspected) exposure to covid-19: Secondary | ICD-10-CM

## 2019-08-25 LAB — SARS-COV-2, NAA 2 DAY TAT

## 2019-08-25 LAB — NOVEL CORONAVIRUS, NAA: SARS-CoV-2, NAA: DETECTED — AB

## 2020-05-30 ENCOUNTER — Other Ambulatory Visit: Payer: Medicaid Other

## 2020-05-31 ENCOUNTER — Other Ambulatory Visit: Payer: Medicaid Other

## 2021-11-13 ENCOUNTER — Other Ambulatory Visit: Payer: Self-pay

## 2021-11-13 ENCOUNTER — Emergency Department (HOSPITAL_BASED_OUTPATIENT_CLINIC_OR_DEPARTMENT_OTHER)
Admission: EM | Admit: 2021-11-13 | Discharge: 2021-11-13 | Disposition: A | Discharge status: Home / Self Care | Payer: Medicaid Other | Attending: Emergency Medicine | Admitting: Emergency Medicine

## 2021-11-13 ENCOUNTER — Encounter (HOSPITAL_BASED_OUTPATIENT_CLINIC_OR_DEPARTMENT_OTHER): Payer: Self-pay | Admitting: Emergency Medicine

## 2021-11-13 DIAGNOSIS — H1032 Unspecified acute conjunctivitis, left eye: Secondary | ICD-10-CM | POA: Diagnosis not present

## 2021-11-13 DIAGNOSIS — H11432 Conjunctival hyperemia, left eye: Secondary | ICD-10-CM | POA: Diagnosis present

## 2021-11-13 MED ORDER — POLYMYXIN B-TRIMETHOPRIM 10000-0.1 UNIT/ML-% OP SOLN: 1.0000 [drp] | Freq: Four times a day (QID) | OPHTHALMIC | 0 refills | Status: DC

## 2021-11-13 MED ORDER — POLYMYXIN B-TRIMETHOPRIM 10000-0.1 UNIT/ML-% OP SOLN: 1.0000 [drp] | Freq: Four times a day (QID) | OPHTHALMIC | 0 refills | Status: AC

## 2021-11-16 ENCOUNTER — Other Ambulatory Visit: Payer: Self-pay

## 2021-11-16 ENCOUNTER — Emergency Department (HOSPITAL_BASED_OUTPATIENT_CLINIC_OR_DEPARTMENT_OTHER)
Admission: EM | Admit: 2021-11-16 | Discharge: 2021-11-16 | Disposition: A | Discharge status: Home / Self Care | Payer: Medicaid Other | Attending: Emergency Medicine | Admitting: Emergency Medicine

## 2021-11-16 ENCOUNTER — Encounter (HOSPITAL_BASED_OUTPATIENT_CLINIC_OR_DEPARTMENT_OTHER): Payer: Self-pay | Admitting: *Deleted

## 2021-11-16 DIAGNOSIS — R6889 Other general symptoms and signs: Secondary | ICD-10-CM

## 2021-11-16 DIAGNOSIS — Z87891 Personal history of nicotine dependence: Secondary | ICD-10-CM | POA: Insufficient documentation

## 2021-11-16 DIAGNOSIS — Z20822 Contact with and (suspected) exposure to covid-19: Secondary | ICD-10-CM | POA: Diagnosis not present

## 2021-11-16 DIAGNOSIS — H1033 Unspecified acute conjunctivitis, bilateral: Secondary | ICD-10-CM | POA: Insufficient documentation

## 2021-11-16 LAB — GROUP A STREP BY PCR: Group A Strep by PCR: NOT DETECTED

## 2021-11-16 LAB — SARS CORONAVIRUS 2 BY RT PCR: SARS Coronavirus 2 by RT PCR: NEGATIVE

## 2021-11-16 MED ORDER — ACETAMINOPHEN 325 MG PO TABS
650.0000 mg | ORAL_TABLET | Freq: Once | ORAL | Status: AC
  Administered 2021-11-16: 650 mg via ORAL
  Filled 2021-11-16: qty 2

## 2021-11-16 NOTE — ED Notes (Signed)
Pt verbalizes understanding of discharge instructions. Opportunity for questioning and answers were provided. Pt discharged from ED to home with family.    

## 2021-11-16 NOTE — ED Triage Notes (Signed)
Pt is here for URI with congestion, sore throat, headache, diarrhea, body aches and bilateral eye swelling, redness and drainage (tears, not purulent). Pt has been taking eye drops without any improvement.

## 2022-05-17 ENCOUNTER — Emergency Department (HOSPITAL_BASED_OUTPATIENT_CLINIC_OR_DEPARTMENT_OTHER)
Admission: EM | Admit: 2022-05-17 | Discharge: 2022-05-17 | Disposition: A | Discharge status: Home / Self Care | Payer: Medicaid Other | Attending: Emergency Medicine | Admitting: Emergency Medicine

## 2022-05-17 ENCOUNTER — Encounter (HOSPITAL_BASED_OUTPATIENT_CLINIC_OR_DEPARTMENT_OTHER): Payer: Self-pay | Admitting: Emergency Medicine

## 2022-05-17 ENCOUNTER — Emergency Department (HOSPITAL_BASED_OUTPATIENT_CLINIC_OR_DEPARTMENT_OTHER): Payer: Medicaid Other

## 2022-05-17 DIAGNOSIS — N83209 Unspecified ovarian cyst, unspecified side: Secondary | ICD-10-CM

## 2022-05-17 DIAGNOSIS — D649 Anemia, unspecified: Secondary | ICD-10-CM | POA: Diagnosis not present

## 2022-05-17 DIAGNOSIS — N83299 Other ovarian cyst, unspecified side: Secondary | ICD-10-CM | POA: Insufficient documentation

## 2022-05-17 DIAGNOSIS — R1084 Generalized abdominal pain: Secondary | ICD-10-CM

## 2022-05-17 DIAGNOSIS — R112 Nausea with vomiting, unspecified: Secondary | ICD-10-CM

## 2022-05-17 DIAGNOSIS — Z1152 Encounter for screening for COVID-19: Secondary | ICD-10-CM | POA: Diagnosis not present

## 2022-05-17 LAB — RESP PANEL BY RT-PCR (RSV, FLU A&B, COVID)  RVPGX2
Influenza A by PCR: NEGATIVE
Influenza B by PCR: NEGATIVE
Resp Syncytial Virus by PCR: NEGATIVE
SARS Coronavirus 2 by RT PCR: NEGATIVE

## 2022-05-17 LAB — CBC
HCT: 35.9 % — ABNORMAL LOW (ref 36.0–46.0)
Hemoglobin: 11.7 g/dL — ABNORMAL LOW (ref 12.0–15.0)
MCH: 28.9 pg (ref 26.0–34.0)
MCHC: 32.6 g/dL (ref 30.0–36.0)
MCV: 88.6 fL (ref 80.0–100.0)
Platelets: 294 10*3/uL (ref 150–400)
RBC: 4.05 MIL/uL (ref 3.87–5.11)
RDW: 14.2 % (ref 11.5–15.5)
WBC: 10.4 10*3/uL (ref 4.0–10.5)
nRBC: 0 % (ref 0.0–0.2)

## 2022-05-17 LAB — COMPREHENSIVE METABOLIC PANEL
ALT: 19 U/L (ref 0–44)
AST: 23 U/L (ref 15–41)
Albumin: 4.7 g/dL (ref 3.5–5.0)
Alkaline Phosphatase: 39 U/L (ref 38–126)
Anion gap: 12 (ref 5–15)
BUN: 11 mg/dL (ref 6–20)
CO2: 22 mmol/L (ref 22–32)
Calcium: 9.3 mg/dL (ref 8.9–10.3)
Chloride: 106 mmol/L (ref 98–111)
Creatinine, Ser: 0.72 mg/dL (ref 0.44–1.00)
GFR, Estimated: >60 mL/min (ref >60)
Glucose, Bld: 118 mg/dL — ABNORMAL HIGH (ref 70–99)
Potassium: 3.7 mmol/L (ref 3.5–5.1)
Sodium: 140 mmol/L (ref 135–145)
Total Bilirubin: 0.3 mg/dL (ref 0.3–1.2)
Total Protein: 8.2 g/dL — ABNORMAL HIGH (ref 6.5–8.1)

## 2022-05-17 LAB — URINALYSIS, ROUTINE W REFLEX MICROSCOPIC
Bilirubin Urine: NEGATIVE
Glucose, UA: NEGATIVE mg/dL
Hgb urine dipstick: NEGATIVE
Nitrite: POSITIVE — AB
Protein, ur: 30 mg/dL — AB
Specific Gravity, Urine: 1.024 (ref 1.005–1.030)
pH: 6.5 (ref 5.0–8.0)

## 2022-05-17 LAB — PREGNANCY, URINE: Preg Test, Ur: NEGATIVE

## 2022-05-17 LAB — LIPASE, BLOOD: Lipase: 19 U/L (ref 11–51)

## 2022-05-17 MED ORDER — ONDANSETRON 4 MG PO TBDP: 4.0000 mg | ORAL_TABLET | Freq: Three times a day (TID) | ORAL | 0 refills | Status: AC | PRN

## 2022-05-17 MED ORDER — KETOROLAC TROMETHAMINE 10 MG PO TABS: 10.0000 mg | ORAL_TABLET | Freq: Four times a day (QID) | ORAL | 0 refills | Status: AC | PRN

## 2022-05-17 MED ORDER — IOHEXOL 300 MG/ML  SOLN
100.0000 mL | Freq: Once | INTRAMUSCULAR | Status: AC | PRN
  Administered 2022-05-17: 100 mL via INTRAVENOUS

## 2022-05-17 MED ORDER — ONDANSETRON HCL 4 MG/2ML IJ SOLN
4.0000 mg | Freq: Once | INTRAMUSCULAR | Status: AC | PRN
  Administered 2022-05-17: 4 mg via INTRAVENOUS
  Filled 2022-05-17: qty 2

## 2022-05-17 MED ORDER — KETOROLAC TROMETHAMINE 15 MG/ML IJ SOLN
15.0000 mg | Freq: Once | INTRAMUSCULAR | Status: AC
  Administered 2022-05-17: 15 mg via INTRAVENOUS
  Filled 2022-05-17: qty 1

## 2022-05-17 MED ORDER — MORPHINE SULFATE (PF) 4 MG/ML IV SOLN
4.0000 mg | Freq: Once | INTRAVENOUS | Status: AC
  Administered 2022-05-17: 4 mg via INTRAVENOUS
  Filled 2022-05-17: qty 1

## 2022-05-17 MED ORDER — SODIUM CHLORIDE 0.9 % IV BOLUS
1000.0000 mL | Freq: Once | INTRAVENOUS | Status: AC
  Administered 2022-05-17: 1000 mL via INTRAVENOUS

## 2022-05-17 MED ORDER — METOCLOPRAMIDE HCL 5 MG/ML IJ SOLN
5.0000 mg | Freq: Once | INTRAMUSCULAR | Status: AC
  Administered 2022-05-17: 5 mg via INTRAVENOUS
  Filled 2022-05-17: qty 2

## 2022-05-17 MED ORDER — DIPHENHYDRAMINE HCL 50 MG/ML IJ SOLN
12.5000 mg | Freq: Once | INTRAMUSCULAR | Status: AC
  Administered 2022-05-17: 12.5 mg via INTRAVENOUS
  Filled 2022-05-17: qty 1

## 2022-05-17 MED ORDER — DICYCLOMINE HCL 20 MG PO TABS: 20.0000 mg | ORAL_TABLET | Freq: Two times a day (BID) | ORAL | 0 refills | Status: AC

## 2022-05-17 MED ORDER — ONDANSETRON HCL 4 MG/2ML IJ SOLN
4.0000 mg | Freq: Once | INTRAMUSCULAR | Status: AC
  Administered 2022-05-17: 4 mg via INTRAVENOUS
  Filled 2022-05-17: qty 2

## 2022-05-17 NOTE — ED Provider Notes (Signed)
  Physical Exam  BP 110/73   Pulse 97   Temp 98.1 F (36.7 C) (Oral)   Resp 14   Ht 5\' 6"  (1.676 m)   Wt 99.8 kg   SpO2 97%   BMI 35.51 kg/m   Physical Exam  Procedures  Procedures  ED Course / MDM   Clinical Course as of 05/17/22 1118  Mon May 17, 2022  May 19, 2022 Patient still reporting significant abdominal pain after meds. Will send for CT to rule out occult surgical process.  [CS]  0700 Care of the patient signed out to the oncoming team at the change of shift.  [CS]  0701 31 F with epigastric pain, nausea, vomiting, Follow up scan. [VB]  0848 Reassessed patient.  She is complaining of generalized abdominal pain more so in the upper region with nausea and nonbloody nonbilious emesis.  She had a CT scan performed which showed left-sided to ovarian hemorrhagic cyst measuring up to 3.5 cm.  Her UA was dirty with 11-20 squames and she has history of UTIs and denies symptoms consistent.  Will hold off from treating allow for culture to result.  Since patient still having abdominal pain and discomfort which I suspect is more likely from a gastritis or GI illness such as nonspecific viral gastroenteritis/enteritis, ordered for IV Reglan, Toradol and Benadryl.  Ordered for ultrasound to evaluate for torsion.  She is denying any concern for STIs or abnormal GU discharge. [VB]  1116 Ultrasound of the ovaries showed evidence of hemorrhagic cyst but no evidence of torsion.  Clinically does not appear consistent with torsion.  Suspect likely viral GI illness.  Patient tolerating p.o. at bedside and abdominal exam is benign.  Provided information for OB/GYN follow-up and advised strict return precautions.  Discharged with prescription for Toradol, Bentyl and Zofran as needed.  Patient safe for discharge at this time. [VB]    Clinical Course User Index [CS] 7902, MD [VB] Pollyann Savoy, MD   Medical Decision Making Amount and/or Complexity of Data Reviewed Labs:  ordered. Radiology: ordered.  Risk Prescription drug management.          Mardene Sayer, MD 05/17/22 1118

## 2022-05-17 NOTE — Discharge Instructions (Addendum)
You have been seen in the Emergency Department (ED)  today for abdominal pain, nausea and vomiting.  Your work up today has not shown a clear cause for your symptoms, but they may be due to a viral infection or food poisoning. You have been prescribed Zofran; please use as prescribed as needed for your nausea.  You have also been prescribed Toradol and Bentyl to help with your abdominal pain.  Your imaging did show evidence of a hemorrhagic ovarian cyst.  Make an appoint with OB/GYN doctors listed in your discharge paperwork to make an appointment.  Follow up with your doctor as soon as possible, ideally within one week, regarding today's emergent visit and your symptoms of nausea/vomiting.   Return to the Emergency Department (ED)  if you develop severe abdominal pain, bloody vomiting, bloody diarrhea, if you are unable to tolerate fluids due to vomiting, or if you develop other symptoms that concern you.

## 2022-05-17 NOTE — ED Provider Notes (Signed)
[facility] EMERGENCY DEPT  Provider Note  CSN: [id number] Arrival date & time: 05/17/22 0454  History Chief Complaint  Patient presents with   Emesis    Lauren Diaz is a 31 y.o. female with no significant PMH reports she was in her usual state of health when she went to bed, she began to have N/V and abdominal discomfort a few hours ago. Abdominal pain is generalized, not worse with eating. No diarrhea. No vomiting. No sick contacts. Denies any urinary symptoms.    Home Medications Prior to Admission medications   Medication Sig Start Date End Date Taking? Authorizing Provider  acetaminophen (TYLENOL) 500 MG tablet Take 500 mg by mouth as needed for headache.    [provider]  pantoprazole (PROTONIX) 20 MG tablet Take 1 tablet (20 mg total) by mouth 2 (two) times daily. 05/21/18 06/20/18  Rolm Bookbinder, CNM  Prenatal Vit-Fe Fumarate-FA (MULTIVITAMIN-PRENATAL) 27-0.8 MG TABS tablet Take 1 tablet by mouth daily at 12 noon.    [provider]     Allergies    Penicillins   Review of Systems   Review of Systems Please see HPI for pertinent positives and negatives  Physical Exam BP 110/73   Pulse 97   Temp 98.1 F (36.7 C) (Oral)   Resp 18   Ht 5\' 6"  (1.676 m)   Wt 99.8 kg   SpO2 97%   BMI 35.51 kg/m   Physical Exam Vitals and nursing note reviewed.  Constitutional:      Appearance: Normal appearance.  HENT:     Head: Normocephalic and atraumatic.     Nose: Nose normal.     Mouth/Throat:     Mouth: Mucous membranes are moist.  Eyes:     Extraocular Movements: Extraocular movements intact.     Conjunctiva/sclera: Conjunctivae normal.  Cardiovascular:     Rate and Rhythm: Normal rate.  Pulmonary:     Effort: Pulmonary effort is normal.     Breath sounds: Normal breath sounds.  Abdominal:     General: Abdomen is flat.     Palpations: Abdomen is soft.     Tenderness: There is no abdominal tenderness. There is no  guarding.  Musculoskeletal:        General: No swelling. Normal range of motion.     Cervical back: Neck supple.  Skin:    General: Skin is warm and dry.  Neurological:     General: No focal deficit present.     Mental Status: She is alert.  Psychiatric:        Mood and Affect: Mood normal.     ED Results / Procedures / Treatments   EKG None  Procedures Procedures  Medications Ordered in the ED Medications  iohexol (OMNIPAQUE) 300 MG/ML solution 100 mL (has no administration in time range)  ondansetron (ZOFRAN) injection 4 mg (4 mg Intravenous Given 05/17/22 0514)  ondansetron (ZOFRAN) injection 4 mg (4 mg Intravenous Given 05/17/22 0627)  morphine (PF) 4 MG/ML injection 4 mg (4 mg Intravenous Given 05/17/22 0627)  sodium chloride 0.9 % bolus 1,000 mL (1,000 mLs Intravenous New Bag/Given 05/17/22 05/19/22)    Initial Impression and Plan  Patient here with N/V and abdominal discomfort. Abdominal exam is benign. Suspect a viral gastroenteritis. Labs done in triage show CBC with mild anemia, about at baseline. CMP is unremarkable, Lipase is normal. HCG is neg. Covid/flu/RSV negative. UA with LE and nitrates, but few WBC and some squamous cells which may indicated  contaminated specimen. She is not having urinary symptoms or fever. Will plan pain/nausea meds and IVF for comfort. Will hold off on imaging for now as her abdominal exam is benign.   ED Course   Clinical Course as of 05/17/22 5681  [facility] May 17, 2022  2751 Patient still reporting significant abdominal pain after meds. Will send for CT to rule out occult surgical process.  [CS]  0700 Care of the patient signed out to the oncoming team at the change of shift.  [CS]  0701 31 F with epigastric pain, nausea, vomiting, Follow up scan. [VB]    Clinical Course User Index [CS] Pollyann Savoy, MD [VB] Mardene Sayer, MD     MDM Rules/Calculators/A&P Medical Decision Making Amount and/or Complexity of Data  Reviewed Labs: ordered. Decision-making details documented in ED Course. Radiology: ordered.  Risk Prescription drug management. Parenteral controlled substances.    Final Clinical Impression(s) / ED Diagnoses Final diagnoses:  None    Rx / DC Orders ED Discharge Orders     None        Pollyann Savoy, MD 05/17/22 [phone]

## 2022-05-17 NOTE — ED Triage Notes (Signed)
Patient presents C/O generalized abdominal pain, N/V onset 11PM. Denies diarrhea.

## 2022-07-09 ENCOUNTER — Ambulatory Visit: Payer: Medicaid Other

## 2022-07-30 ENCOUNTER — Emergency Department (HOSPITAL_BASED_OUTPATIENT_CLINIC_OR_DEPARTMENT_OTHER)
Admission: EM | Admit: 2022-07-30 | Discharge: 2022-07-30 | Disposition: A | Discharge status: Home / Self Care | Payer: Medicaid Other | Attending: Emergency Medicine | Admitting: Emergency Medicine

## 2022-07-30 ENCOUNTER — Other Ambulatory Visit: Payer: Self-pay

## 2022-07-30 ENCOUNTER — Encounter (HOSPITAL_BASED_OUTPATIENT_CLINIC_OR_DEPARTMENT_OTHER): Payer: Self-pay | Admitting: Emergency Medicine

## 2022-07-30 DIAGNOSIS — D649 Anemia, unspecified: Secondary | ICD-10-CM | POA: Diagnosis not present

## 2022-07-30 DIAGNOSIS — R112 Nausea with vomiting, unspecified: Secondary | ICD-10-CM | POA: Diagnosis present

## 2022-07-30 DIAGNOSIS — D72829 Elevated white blood cell count, unspecified: Secondary | ICD-10-CM | POA: Insufficient documentation

## 2022-07-30 DIAGNOSIS — K529 Noninfective gastroenteritis and colitis, unspecified: Secondary | ICD-10-CM | POA: Diagnosis not present

## 2022-07-30 DIAGNOSIS — Z1152 Encounter for screening for COVID-19: Secondary | ICD-10-CM | POA: Diagnosis not present

## 2022-07-30 LAB — CBC
HCT: 36 % (ref 36.0–46.0)
Hemoglobin: 11.8 g/dL — ABNORMAL LOW (ref 12.0–15.0)
MCH: 29.2 pg (ref 26.0–34.0)
MCHC: 32.8 g/dL (ref 30.0–36.0)
MCV: 89.1 fL (ref 80.0–100.0)
Platelets: 306 10*3/uL (ref 150–400)
RBC: 4.04 MIL/uL (ref 3.87–5.11)
RDW: 14 % (ref 11.5–15.5)
WBC: 11.7 10*3/uL — ABNORMAL HIGH (ref 4.0–10.5)
nRBC: 0 % (ref 0.0–0.2)

## 2022-07-30 LAB — COMPREHENSIVE METABOLIC PANEL
ALT: 13 U/L (ref 0–44)
AST: 14 U/L — ABNORMAL LOW (ref 15–41)
Albumin: 3.8 g/dL (ref 3.5–5.0)
Alkaline Phosphatase: 45 U/L (ref 38–126)
Anion gap: 11 (ref 5–15)
BUN: 10 mg/dL (ref 6–20)
CO2: 22 mmol/L (ref 22–32)
Calcium: 9 mg/dL (ref 8.9–10.3)
Chloride: 104 mmol/L (ref 98–111)
Creatinine, Ser: 0.77 mg/dL (ref 0.44–1.00)
GFR, Estimated: >60 mL/min (ref >60)
Glucose, Bld: 108 mg/dL — ABNORMAL HIGH (ref 70–99)
Potassium: 4 mmol/L (ref 3.5–5.1)
Sodium: 137 mmol/L (ref 135–145)
Total Bilirubin: 0.4 mg/dL (ref 0.3–1.2)
Total Protein: 7.6 g/dL (ref 6.5–8.1)

## 2022-07-30 LAB — RESP PANEL BY RT-PCR (RSV, FLU A&B, COVID)  RVPGX2
Influenza A by PCR: NEGATIVE
Influenza B by PCR: NEGATIVE
Resp Syncytial Virus by PCR: NEGATIVE
SARS Coronavirus 2 by RT PCR: NEGATIVE

## 2022-07-30 LAB — LIPASE, BLOOD: Lipase: 11 U/L (ref 11–51)

## 2022-07-30 LAB — HCG, SERUM, QUALITATIVE: Preg, Serum: NEGATIVE

## 2022-07-30 MED ORDER — ONDANSETRON HCL 4 MG PO TABS: 4.0000 mg | ORAL_TABLET | Freq: Four times a day (QID) | ORAL | 0 refills | Status: AC

## 2022-07-30 MED ORDER — PROCHLORPERAZINE EDISYLATE 10 MG/2ML IJ SOLN
10.0000 mg | Freq: Once | INTRAMUSCULAR | Status: AC
  Administered 2022-07-30: 10 mg via INTRAVENOUS
  Filled 2022-07-30: qty 2

## 2022-07-30 MED ORDER — LACTATED RINGERS IV BOLUS
1000.0000 mL | Freq: Once | INTRAVENOUS | Status: AC
  Administered 2022-07-30: 1000 mL via INTRAVENOUS

## 2022-07-30 MED ORDER — DIPHENHYDRAMINE HCL 50 MG/ML IJ SOLN
25.0000 mg | Freq: Once | INTRAMUSCULAR | Status: AC
  Administered 2022-07-30: 25 mg via INTRAVENOUS
  Filled 2022-07-30: qty 1

## 2022-07-30 MED ORDER — ONDANSETRON HCL 4 MG/2ML IJ SOLN
4.0000 mg | Freq: Once | INTRAMUSCULAR | Status: AC
  Administered 2022-07-30: 4 mg via INTRAVENOUS
  Filled 2022-07-30: qty 2

## 2022-07-30 NOTE — ED Notes (Signed)
Tolerated PO challenge without difficulty.

## 2022-07-30 NOTE — Discharge Instructions (Addendum)
Please drink plenty of fluids, follow up closely with PCP, and rest at home

## 2022-07-30 NOTE — ED Triage Notes (Signed)
Pt arrives to ED via [facility] with c/o emesis and abdominal pains that started this morning. She notes daughter has same symptoms.

## 2022-07-30 NOTE — ED Notes (Signed)
PO challenge started with water.

## 2022-07-30 NOTE — ED Provider Notes (Signed)
[facility] Provider Note   CSN: [id number] Arrival date & time: 07/30/22  1632     History  Chief Complaint  Patient presents with   Abdominal Pain   Emesis    Lauren Diaz is a 32 y.o. female With noncontributory past medical history presents with 1 day of emesis, abdominal pain, nausea continuously since this morning.  She reports her daughter had similar symptoms.  She reports abdominal pain is mostly when she is retching.  She denies any blood in emesis or in stool.  She reports no fever at home but has felt some chills.  Denies dysuria, alcohol, drug use.   Abdominal Pain Associated symptoms: vomiting   Emesis Associated symptoms: abdominal pain        Home Medications Prior to Admission medications   Medication Sig Start Date End Date Taking? Authorizing Provider  ondansetron (ZOFRAN) 4 MG tablet Take 1 tablet (4 mg total) by mouth every 6 (six) hours. 07/30/22  Yes Tayveon Lombardo H, PA-C  acetaminophen (TYLENOL) 500 MG tablet Take 500 mg by mouth as needed for headache.    [provider]  dicyclomine (BENTYL) 20 MG tablet Take 1 tablet (20 mg total) by mouth 2 (two) times daily. 05/17/22   Elgie Congo, MD  ketorolac (TORADOL) 10 MG tablet Take 1 tablet (10 mg total) by mouth every 6 (six) hours as needed. 05/17/22   Elgie Congo, MD  ondansetron (ZOFRAN-ODT) 4 MG disintegrating tablet Take 1 tablet (4 mg total) by mouth every 8 (eight) hours as needed for nausea or vomiting. 05/17/22   Elgie Congo, MD  pantoprazole (PROTONIX) 20 MG tablet Take 1 tablet (20 mg total) by mouth 2 (two) times daily. 05/21/18 06/20/18  Wende Mott, CNM  Prenatal Vit-Fe Fumarate-FA (MULTIVITAMIN-PRENATAL) 27-0.8 MG TABS tablet Take 1 tablet by mouth daily at 12 noon.    [provider]      Allergies    Penicillins    Review of Systems   Review of Systems  Gastrointestinal:  Positive for  abdominal pain and vomiting.  All other systems reviewed and are negative.   Physical Exam Updated Vital Signs BP 111/64   Pulse 94   Temp 98.4 F (36.9 C)   Resp 18   Ht '5\' 6"'$  (1.676 m)   Wt 99.8 kg   SpO2 100%   BMI 35.51 kg/m  Physical Exam Vitals and nursing note reviewed.  Constitutional:      General: She is not in acute distress.    Appearance: Normal appearance.  HENT:     Head: Normocephalic and atraumatic.     Comments: Dry mucous membranes Eyes:     General:        Right eye: No discharge.        Left eye: No discharge.  Cardiovascular:     Rate and Rhythm: Normal rate and regular rhythm.     Heart sounds: No murmur heard.    No friction rub. No gallop.  Pulmonary:     Effort: Pulmonary effort is normal.     Breath sounds: Normal breath sounds.  Abdominal:     General: Bowel sounds are normal.     Palpations: Abdomen is soft.     Comments: Mild tenderness to palpation of the abdomen, no rebound, rigidity, guarding  Skin:    General: Skin is warm and dry.     Capillary Refill: Capillary refill takes less than 2 seconds.  Neurological:     Mental Status: She is alert and oriented to person, place, and time.  Psychiatric:        Mood and Affect: Mood normal.        Behavior: Behavior normal.     ED Results / Procedures / Treatments   Labs (all labs ordered are listed, but only abnormal results are displayed) Labs Reviewed  COMPREHENSIVE METABOLIC PANEL - Abnormal; Notable for the following components:      Result Value   Glucose, Bld 108 (*)    AST 14 (*)    All other components within normal limits  CBC - Abnormal; Notable for the following components:   WBC 11.7 (*)    Hemoglobin 11.8 (*)    All other components within normal limits  RESP PANEL BY RT-PCR (RSV, FLU A&B, COVID)  RVPGX2  LIPASE, BLOOD  HCG, SERUM, QUALITATIVE    EKG None  Radiology No results found.  Procedures Procedures    Medications Ordered in ED Medications   lactated ringers bolus 1,000 mL (0 mLs Intravenous Stopped 07/30/22 1900)  ondansetron (ZOFRAN) injection 4 mg (4 mg Intravenous Given 07/30/22 1657)  prochlorperazine (COMPAZINE) injection 10 mg (10 mg Intravenous Given 07/30/22 1918)  diphenhydrAMINE (BENADRYL) injection 25 mg (25 mg Intravenous Given 07/30/22 1918)    ED Course/ Medical Decision Making/ A&P                              Medical Decision Making Amount and/or Complexity of Data Reviewed Labs: ordered.  Risk Prescription drug management.   This patient is a 32 y.o. female  who presents to the ED for concern of nausea, vomiting, abdominal pain.   Differential diagnoses prior to evaluation: The emergent differential diagnosis includes, but is not limited to,  esophagitis, gastritis, peptic ulcer disease, esophageal rupture, gastric rupture, Boerhaave's, Mallory-Weiss, pancreatitis, cholecystitis, cholangitis, acute mesenteric ischemia, atypical chest pain or ACS, lower lobar pneumonia versus other . This is not an exhaustive differential.   Past Medical History / Co-morbidities: Noncontributory  Additional history: Chart reviewed. Pertinent results include: Reviewed lab work, imaging from previous emergency department evaluations, she has had abdominal pain and flulike symptoms before but not super frequently, around once every 6 months at this time  Physical Exam: Physical exam performed. The pertinent findings include: Patient with some mild tenderness palpation of the epigastric region, she has some dry mucous membranes, but otherwise well-appearing with stable vital signs  Lab Tests/Imaging studies: I personally interpreted labs/imaging and the pertinent results include: CMP is overall unremarkable, mildly elevated glucose at 108 on nonfasting labs.  RVP negative for COVID, flu, lipase normal, pregnancy test negative, her CBC with mild leukocytosis of 11.7, mild anemia hemoglobin 11.8.   Medications: I ordered  medication including Benadryl, Compazine, fluid bolus, Zofran.  I have reviewed the patients home medicines and have made adjustments as needed.   Disposition: After consideration of the diagnostic results and the patients response to treatment, I feel that patient symptoms are consistent with acute gastroenteritis, versus other viral syndrome, versus gastritis, GERD, cyclic vomiting, versus other, she is able to tolerate fluids at time of discharge, will discharge with Zofran and encourage close PCP follow-up, as well as encouraged fluid rehydration at home.   emergency department workup does not suggest an emergent condition requiring admission or immediate intervention beyond what has been performed at this time. The plan is: as above. The patient is safe  for discharge and has been instructed to return immediately for worsening symptoms, change in symptoms or any other concerns.  Final Clinical Impression(s) / ED Diagnoses Final diagnoses:  Gastroenteritis  Nausea and vomiting, unspecified vomiting type    Rx / DC Orders ED Discharge Orders          Ordered    ondansetron (ZOFRAN) 4 MG tablet  Every 6 hours        07/30/22 2004              Dorien Chihuahua 07/30/22 2114    Fredia Sorrow, MD 07/30/22 2328

## 2022-11-21 ENCOUNTER — Encounter (HOSPITAL_BASED_OUTPATIENT_CLINIC_OR_DEPARTMENT_OTHER): Payer: Self-pay

## 2022-11-21 ENCOUNTER — Emergency Department (HOSPITAL_BASED_OUTPATIENT_CLINIC_OR_DEPARTMENT_OTHER)
Admission: EM | Admit: 2022-11-21 | Discharge: 2022-11-22 | Disposition: A | Discharge status: Home / Self Care | Payer: Medicaid Other | Attending: Emergency Medicine | Admitting: Emergency Medicine

## 2022-11-21 ENCOUNTER — Emergency Department (HOSPITAL_BASED_OUTPATIENT_CLINIC_OR_DEPARTMENT_OTHER): Payer: Medicaid Other | Admitting: Radiology

## 2022-11-21 ENCOUNTER — Other Ambulatory Visit: Payer: Self-pay

## 2022-11-21 DIAGNOSIS — R059 Cough, unspecified: Secondary | ICD-10-CM | POA: Diagnosis present

## 2022-11-21 DIAGNOSIS — J189 Pneumonia, unspecified organism: Secondary | ICD-10-CM | POA: Insufficient documentation

## 2022-11-21 DIAGNOSIS — Z1152 Encounter for screening for COVID-19: Secondary | ICD-10-CM | POA: Diagnosis not present

## 2022-11-21 LAB — RESP PANEL BY RT-PCR (RSV, FLU A&B, COVID)  RVPGX2
Influenza A by PCR: NEGATIVE
Influenza B by PCR: NEGATIVE
Resp Syncytial Virus by PCR: NEGATIVE
SARS Coronavirus 2 by RT PCR: NEGATIVE

## 2022-11-21 MED ORDER — DOXYCYCLINE HYCLATE 100 MG PO CAPS: 100.0000 mg | ORAL_CAPSULE | Freq: Two times a day (BID) | ORAL | 0 refills | Status: DC

## 2022-11-21 MED ORDER — GUAIFENESIN ER 600 MG PO TB12: 600.0000 mg | ORAL_TABLET | Freq: Two times a day (BID) | ORAL | 0 refills | Status: AC

## 2022-11-21 MED ORDER — ACETAMINOPHEN 325 MG PO TABS
650.0000 mg | ORAL_TABLET | Freq: Once | ORAL | Status: AC | PRN
  Administered 2022-11-21: 650 mg via ORAL
  Filled 2022-11-21: qty 2

## 2022-11-21 NOTE — ED Notes (Signed)
Call to radiology to follow up on delay getting xray

## 2022-11-21 NOTE — ED Triage Notes (Signed)
Pt reports productive cough of yellow phlegm, associated with chills, pain in chest with cough, sweats, body aches, and an episode this evening of her heart racing. Symptoms unrelieved with OTC cough meds and Ibuprofen. She reports this started about 3 to 4 days ago.

## 2022-11-21 NOTE — Discharge Instructions (Addendum)
Please follow-up with your primary care doctor, return to the ER if you feel like you are having worsening shortness of breath, cough.  I prescribed you doxycycline please take this for the next 7 days, for your pneumonia.  Do not stop taking if you start feeling better.  I have also prescribed you some Mucinex to help thin your mucus out.

## 2022-11-21 NOTE — ED Provider Notes (Signed)
[place] EMERGENCY DEPARTMENT AT [facility] Provider Note   CSN: [id number] Arrival date & time: 11/21/22  2025     History  Chief Complaint  Patient presents with   Cough    Lauren Diaz is a 32 y.o. female, no chronic past medical history, who presents to the ED secondary to shortness of breath, cough, this been going on for the last couple days.  She states that this has been going on for the last 3 to 4 days, and she has chills, sweats, and bodyaches.  Has been coughing up yellow phlegm.  Denies any chest pain.    Home Medications Prior to Admission medications   Medication Sig Start Date End Date Taking? Authorizing Provider  doxycycline (VIBRAMYCIN) 100 MG capsule Take 1 capsule (100 mg total) by mouth 2 (two) times daily. 11/21/22  Yes Londen Lorge L, PA  guaiFENesin (MUCINEX) 600 MG 12 hr tablet Take 1 tablet (600 mg total) by mouth 2 (two) times daily for 7 days. 11/21/22 11/28/22 Yes Margot Oriordan L, PA  acetaminophen (TYLENOL) 500 MG tablet Take 500 mg by mouth as needed for headache.    [provider]  dicyclomine (BENTYL) 20 MG tablet Take 1 tablet (20 mg total) by mouth 2 (two) times daily. 05/17/22   Mardene Sayer, MD  ketorolac (TORADOL) 10 MG tablet Take 1 tablet (10 mg total) by mouth every 6 (six) hours as needed. 05/17/22   Mardene Sayer, MD  ondansetron (ZOFRAN) 4 MG tablet Take 1 tablet (4 mg total) by mouth every 6 (six) hours. 07/30/22   Prosperi, Christian H, PA-C  ondansetron (ZOFRAN-ODT) 4 MG disintegrating tablet Take 1 tablet (4 mg total) by mouth every 8 (eight) hours as needed for nausea or vomiting. 05/17/22   Mardene Sayer, MD  pantoprazole (PROTONIX) 20 MG tablet Take 1 tablet (20 mg total) by mouth 2 (two) times daily. 05/21/18 06/20/18  Rolm Bookbinder, CNM  Prenatal Vit-Fe Fumarate-FA (MULTIVITAMIN-PRENATAL) 27-0.8 MG TABS tablet Take 1 tablet by mouth daily at 12 noon.    [provider]       Allergies    Penicillins    Review of Systems   Review of Systems  Respiratory:  Positive for cough and shortness of breath.   Cardiovascular:  Negative for chest pain.    Physical Exam Updated Vital Signs BP 121/84 (BP Location: Right Arm)   Pulse 88   Temp (!) 101.8 F (38.8 C) (Oral)   Resp 20   Ht 5\' 6"  (1.676 m)   Wt 99.8 kg   LMP 11/21/2022 (Approximate)   SpO2 98%   Breastfeeding No   BMI 35.51 kg/m  Physical Exam Vitals and nursing note reviewed.  Constitutional:      General: She is not in acute distress.    Appearance: She is well-developed.  HENT:     Head: Normocephalic and atraumatic.  Eyes:     Conjunctiva/sclera: Conjunctivae normal.  Cardiovascular:     Rate and Rhythm: Normal rate and regular rhythm.     Heart sounds: No murmur heard. Pulmonary:     Effort: Pulmonary effort is normal. No respiratory distress.     Comments: +crackles in LUL Abdominal:     Palpations: Abdomen is soft.     Tenderness: There is no abdominal tenderness.  Musculoskeletal:        General: No swelling.     Cervical back: Neck supple.  Skin:    General: Skin is  warm and dry.     Capillary Refill: Capillary refill takes less than 2 seconds.  Neurological:     Mental Status: She is alert.  Psychiatric:        Mood and Affect: Mood normal.     ED Results / Procedures / Treatments   Labs (all labs ordered are listed, but only abnormal results are displayed) Labs Reviewed  RESP PANEL BY RT-PCR (RSV, FLU A&B, COVID)  RVPGX2    EKG None  Radiology DG Chest 2 View  Result Date: 11/21/2022 CLINICAL DATA:  Coughing with chills, sweats and chest pain. EXAM: CHEST - 2 VIEW COMPARISON:  None Available. FINDINGS: The heart size and mediastinal contours are within normal limits. There is left upper lobe perihilar patchy airspace disease, most likely due to pneumonia in this clinical setting. Remainder of the lungs are clear. The sulci are sharp. The visualized skeletal  structures are unremarkable. IMPRESSION: Left upper lobe perihilar patchy airspace disease, most likely due to pneumonia in this clinical setting. Follow-up study recommended after treatment to ensure clearing. Electronically Signed   By: Almira Bar M.D.   On: 11/21/2022 23:07    Procedures Procedures    Medications Ordered in ED Medications  acetaminophen (TYLENOL) tablet 650 mg (650 mg Oral Given 11/21/22 2052)    ED Course/ Medical Decision Making/ A&P                             Medical Decision Making Patient is a 32 year old female, here for shortness of breath, productive cough, fever and chills this been going on for the last 3 to 4 days.  She is taking cough medicines without any relief.  Will obtain chest x-ray, COVID/flu for further evaluation.  Amount and/or Complexity of Data Reviewed Labs:     Details: COVID/flu/RSV negative Radiology: ordered.    Details: Chest x-ray shows left upper lobe patchy opacity consistent with pneumonia Discussion of management or test interpretation with external provider(s): Discussed with patient, she has pneumonia, we discharged her with doxycycline as she is currently menstruating, and allergic to penicillin.  Discharged with Mucinex, to help with sputum.  Return precautions emphasized.  Risk OTC drugs. Prescription drug management.    Final Clinical Impression(s) / ED Diagnoses Final diagnoses:  Community acquired pneumonia of left lung, unspecified part of lung    Rx / DC Orders ED Discharge Orders          Ordered    doxycycline (VIBRAMYCIN) 100 MG capsule  2 times daily        11/21/22 2351    guaiFENesin (MUCINEX) 600 MG 12 hr tablet  2 times daily        11/21/22 2351              Katyana Trolinger, Harley Alto, [place] 11/21/22 2353    Tegeler, Canary Brim, MD 11/21/22 2358

## 2023-08-31 ENCOUNTER — Emergency Department (HOSPITAL_BASED_OUTPATIENT_CLINIC_OR_DEPARTMENT_OTHER)
Admission: EM | Admit: 2023-08-31 | Discharge: 2023-08-31 | Disposition: A | Discharge status: Home / Self Care | Attending: Emergency Medicine | Admitting: Emergency Medicine

## 2023-08-31 ENCOUNTER — Other Ambulatory Visit: Payer: Self-pay

## 2023-08-31 ENCOUNTER — Encounter (HOSPITAL_BASED_OUTPATIENT_CLINIC_OR_DEPARTMENT_OTHER): Payer: Self-pay

## 2023-08-31 DIAGNOSIS — B9789 Other viral agents as the cause of diseases classified elsewhere: Secondary | ICD-10-CM | POA: Diagnosis not present

## 2023-08-31 DIAGNOSIS — N760 Acute vaginitis: Secondary | ICD-10-CM | POA: Insufficient documentation

## 2023-08-31 DIAGNOSIS — N3000 Acute cystitis without hematuria: Secondary | ICD-10-CM | POA: Insufficient documentation

## 2023-08-31 DIAGNOSIS — B9689 Other specified bacterial agents as the cause of diseases classified elsewhere: Secondary | ICD-10-CM

## 2023-08-31 DIAGNOSIS — R1030 Lower abdominal pain, unspecified: Secondary | ICD-10-CM | POA: Diagnosis present

## 2023-08-31 LAB — CBC WITH DIFFERENTIAL/PLATELET
Abs Immature Granulocytes: 0.01 10*3/uL (ref 0.00–0.07)
Basophils Absolute: 0.1 10*3/uL (ref 0.0–0.1)
Basophils Relative: 1 %
Eosinophils Absolute: 0 10*3/uL (ref 0.0–0.5)
Eosinophils Relative: 0 %
HCT: 37.2 % (ref 36.0–46.0)
Hemoglobin: 12.2 g/dL (ref 12.0–15.0)
Immature Granulocytes: 0 %
Lymphocytes Relative: 36 %
Lymphs Abs: 1.9 10*3/uL (ref 0.7–4.0)
MCH: 30.3 pg (ref 26.0–34.0)
MCHC: 32.8 g/dL (ref 30.0–36.0)
MCV: 92.3 fL (ref 80.0–100.0)
Monocytes Absolute: 0.4 10*3/uL (ref 0.1–1.0)
Monocytes Relative: 8 %
Neutro Abs: 2.9 10*3/uL (ref 1.7–7.7)
Neutrophils Relative %: 55 %
Platelets: 290 10*3/uL (ref 150–400)
RBC: 4.03 MIL/uL (ref 3.87–5.11)
RDW: 13.2 % (ref 11.5–15.5)
WBC: 5.4 10*3/uL (ref 4.0–10.5)
nRBC: 0 % (ref 0.0–0.2)

## 2023-08-31 LAB — URINALYSIS, ROUTINE W REFLEX MICROSCOPIC
Bilirubin Urine: NEGATIVE
Glucose, UA: NEGATIVE mg/dL
Ketones, ur: NEGATIVE mg/dL
Nitrite: POSITIVE — AB
Protein, ur: NEGATIVE mg/dL
Specific Gravity, Urine: 1.018 (ref 1.005–1.030)
pH: 5.5 (ref 5.0–8.0)

## 2023-08-31 LAB — WET PREP, GENITAL
Sperm: NONE SEEN
Trich, Wet Prep: NONE SEEN
WBC, Wet Prep HPF POC: <10 (ref <10)
Yeast Wet Prep HPF POC: NONE SEEN

## 2023-08-31 LAB — COMPREHENSIVE METABOLIC PANEL WITH GFR
ALT: 13 U/L (ref 0–44)
AST: 13 U/L — ABNORMAL LOW (ref 15–41)
Albumin: 4 g/dL (ref 3.5–5.0)
Alkaline Phosphatase: 41 U/L (ref 38–126)
Anion gap: 8 (ref 5–15)
BUN: 5 mg/dL — ABNORMAL LOW (ref 6–20)
CO2: 25 mmol/L (ref 22–32)
Calcium: 8.7 mg/dL — ABNORMAL LOW (ref 8.9–10.3)
Chloride: 105 mmol/L (ref 98–111)
Creatinine, Ser: 0.66 mg/dL (ref 0.44–1.00)
GFR, Estimated: >60 mL/min (ref >60)
Glucose, Bld: 101 mg/dL — ABNORMAL HIGH (ref 70–99)
Potassium: 4.2 mmol/L (ref 3.5–5.1)
Sodium: 138 mmol/L (ref 135–145)
Total Bilirubin: 0.3 mg/dL (ref 0.0–1.2)
Total Protein: 6.9 g/dL (ref 6.5–8.1)

## 2023-08-31 LAB — HIV ANTIBODY (ROUTINE TESTING W REFLEX): HIV Screen 4th Generation wRfx: NONREACTIVE

## 2023-08-31 LAB — HCG, SERUM, QUALITATIVE: Preg, Serum: NEGATIVE

## 2023-08-31 MED ORDER — METRONIDAZOLE 500 MG PO TABS: 500.0000 mg | ORAL_TABLET | Freq: Two times a day (BID) | ORAL | 0 refills | Status: AC

## 2023-08-31 MED ORDER — NAPROXEN 250 MG PO TABS
500.0000 mg | ORAL_TABLET | Freq: Once | ORAL | Status: AC
  Administered 2023-08-31: 500 mg via ORAL
  Filled 2023-08-31: qty 2

## 2023-08-31 MED ORDER — CEPHALEXIN 500 MG PO CAPS: 500.0000 mg | ORAL_CAPSULE | Freq: Two times a day (BID) | ORAL | 0 refills | Status: AC

## 2023-08-31 NOTE — ED Provider Notes (Signed)
 [place] EMERGENCY DEPARTMENT AT [facility] Provider Note   CSN: [id number] Arrival date & time: 08/31/23  7846     History  Chief Complaint  Patient presents with   Pelvic Pain    Lauren Diaz is a 33 y.o. female.  33 year old female with a past medical history of pelvic cyst presents to the ED with a chief complaint of lower abdominal cramping for the past 5 days.  She describes the pain as cramping in nature throughout her entire pelvis.  She reports being diagnosed with a small cyst on Christmas time, states she was told to follow-up with an OB/GYN but she never saw 1.  She reports she went on [employer] and she noted that she may have the same symptoms as when someone has a cyst rupture.  She did not have a menstrual cycle in the month of February, but did have 1 in March.  She is currently sexually active with no birth control and is unsure whether she could be pregnant.  She is endorsing some clear vaginal discharge but nothing abnormal.  She has had no nausea, no fever, no vomiting.  Denies any urinary symptoms.    The history is provided by the patient.  Pelvic Pain Pertinent negatives include no chest pain, no abdominal pain and no shortness of breath.       Home Medications Prior to Admission medications   Medication Sig Start Date End Date Taking? Authorizing Provider  cephALEXin (KEFLEX) 500 MG capsule Take 1 capsule (500 mg total) by mouth 2 (two) times daily for 7 days. 08/31/23 09/07/23 Yes Whittaker Lenis, PA-C  metroNIDAZOLE (FLAGYL) 500 MG tablet Take 1 tablet (500 mg total) by mouth 2 (two) times daily for 7 days. 08/31/23 09/07/23 Yes Lucus Lambertson, Leonie Douglas, PA-C  acetaminophen (TYLENOL) 500 MG tablet Take 500 mg by mouth as needed for headache.    [provider]  dicyclomine (BENTYL) 20 MG tablet Take 1 tablet (20 mg total) by mouth 2 (two) times daily. 05/17/22   Mardene Sayer, MD  ketorolac (TORADOL) 10 MG tablet Take 1 tablet (10 mg total) by mouth  every 6 (six) hours as needed. 05/17/22   Mardene Sayer, MD  ondansetron (ZOFRAN) 4 MG tablet Take 1 tablet (4 mg total) by mouth every 6 (six) hours. 07/30/22   Prosperi, Christian H, PA-C  ondansetron (ZOFRAN-ODT) 4 MG disintegrating tablet Take 1 tablet (4 mg total) by mouth every 8 (eight) hours as needed for nausea or vomiting. 05/17/22   Mardene Sayer, MD  pantoprazole (PROTONIX) 20 MG tablet Take 1 tablet (20 mg total) by mouth 2 (two) times daily. 05/21/18 06/20/18  Rolm Bookbinder, CNM  Prenatal Vit-Fe Fumarate-FA (MULTIVITAMIN-PRENATAL) 27-0.8 MG TABS tablet Take 1 tablet by mouth daily at 12 noon.    [provider]      Allergies    Penicillins    Review of Systems   Review of Systems  Constitutional:  Negative for chills and fever.  Respiratory:  Negative for shortness of breath.   Cardiovascular:  Negative for chest pain.  Gastrointestinal:  Positive for nausea. Negative for abdominal pain, diarrhea and vomiting.  Genitourinary:  Positive for pelvic pain.  Musculoskeletal:  Negative for back pain.  All other systems reviewed and are negative.   Physical Exam Updated Vital Signs BP 119/79 (BP Location: Right Arm)   Pulse 79   Temp (!) 97.5 F (36.4 C)   Resp 17   Ht 5\' 6"  (1.676  m)   Wt 99.8 kg   LMP 08/22/2023 (Approximate)   SpO2 97%   BMI 35.51 kg/m  Physical Exam Vitals and nursing note reviewed.  Constitutional:      General: She is not in acute distress.    Appearance: She is well-developed.  HENT:     Head: Normocephalic and atraumatic.     Mouth/Throat:     Pharynx: No oropharyngeal exudate.  Eyes:     Pupils: Pupils are equal, round, and reactive to light.  Cardiovascular:     Rate and Rhythm: Regular rhythm.     Heart sounds: Normal heart sounds.  Pulmonary:     Effort: Pulmonary effort is normal. No respiratory distress.     Breath sounds: Normal breath sounds.  Abdominal:     General: Bowel sounds are normal. There is no  distension.     Palpations: Abdomen is soft.     Tenderness: There is no abdominal tenderness.  Musculoskeletal:        General: No tenderness or deformity.     Cervical back: Normal range of motion.     Right lower leg: No edema.     Left lower leg: No edema.  Skin:    General: Skin is warm and dry.  Neurological:     Mental Status: She is alert and oriented to person, place, and time.     ED Results / Procedures / Treatments   Labs (all labs ordered are listed, but only abnormal results are displayed) Labs Reviewed  WET PREP, GENITAL - Abnormal; Notable for the following components:      Result Value   Clue Cells Wet Prep HPF POC PRESENT (*)    All other components within normal limits  COMPREHENSIVE METABOLIC PANEL WITH GFR - Abnormal; Notable for the following components:   Glucose, Bld 101 (*)    BUN 5 (*)    Calcium 8.7 (*)    AST 13 (*)    All other components within normal limits  URINALYSIS, ROUTINE W REFLEX MICROSCOPIC - Abnormal; Notable for the following components:   APPearance HAZY (*)    Hgb urine dipstick SMALL (*)    Nitrite POSITIVE (*)    Leukocytes,Ua MODERATE (*)    Bacteria, UA RARE (*)    All other components within normal limits  CBC WITH DIFFERENTIAL/PLATELET  HCG, SERUM, QUALITATIVE  HIV ANTIBODY (ROUTINE TESTING W REFLEX)  GC/CHLAMYDIA PROBE AMP ([place]) NOT AT [facility]    EKG None  Radiology No results found.  Procedures Procedures    Medications Ordered in ED Medications  naproxen (NAPROSYN) tablet 500 mg (has no administration in time range)    ED Course/ Medical Decision Making/ A&P Clinical Course as of 08/31/23 1217  Wed Aug 31, 2023  1146 Nitrite(!): POSITIVE [JS]  1146 Glori Luis): MODERATE [JS]  1146 Bacteria, UA(!): RARE [JS]  1146 WBC, UA: 11-20 [JS]  1202 Clue Cells Wet Prep HPF POC(!): PRESENT [JS]  1202 WBC, Wet Prep HPF POC: <10 [JS]    Clinical Course User Index [JS] Claude Manges, PA-C                                  Medical Decision Making Amount and/or Complexity of Data Reviewed Labs: ordered. Decision-making details documented in ED Course.   This patient presents to the ED for concern of pelvic pain, this involves a number of treatment options, and is a  complaint that carries with it a high risk of complications and morbidity.  The differential diagnosis includes ovarian torsion, ectopic pregnancy, UTI.    Co morbidities: Discussed in HPI   Brief History:  See HPI.   EMR reviewed including pt PMHx, past surgical history and past visits to ER.   See HPI for more details   Lab Tests:  I ordered and independently interpreted labs.  The pertinent results include:    CMP with no electrolyte derangement, creatinine levels unremarkable.  LFTs are within normal limits, there is no pain along the epigastric, right upper quadrant to suggest gallbladder etiology.  hCG is negative.  BC is unremarkable.  UA with small hemoglobin, positive nitrites, moderate leukocytes.  Wet prep is remarkable for clue cells present, less than 10 white blood cell count.   Imaging Studies:  No imaging studies ordered for this patient   Medicines ordered:  I ordered medication including naproxen  for pain control Reevaluation of the patient after these medicines showed that the patient improved I have reviewed the patients home medicines and have made adjustments as needed  Reevaluation:  After the interventions noted above I re-evaluated patient and found that they have :improved   Social Determinants of Health:  The patient's social determinants of health were a factor in the care of this patient   Problem List / ED Course:  33 year old female with no past medical history here with pelvic pain that is been ongoing for the past few days, she endorses some clear vaginal discharge.  She is having some lower abdominal cramping and is concerned that she likely has a cyst that has ruptured.   Diagnosed previously with a cyst in the month of December, did not follow-up with OB/GYN.  However, this morning she googled symptoms of a ruptured cyst and reports she feels that this is likely explaining her pain.  On exam she is nontender, there is no guarding, no rebound.  Her vitals are within normal limits, she is afebrile.  Pregnancy test is negative, no concern for ectopic.  Blood work is unremarkable, CBC with no leukocytosis, CMP is within normal limits.  UA does have remarkable nitrite positive, moderate leukocytes, rare bacteria, 11-20 white blood cell count, wet prep was also obtained which is positive for clue cells concerning for bacterial vaginosis at this time.  Patient was given naproxen here with improvement in symptoms.  I do feel that she is best treated with Flagyl along with Keflex to treat her urinary tract infection and bacterial vaginosis.  She is agreeable to plan and treatment, will follow-up with OB/GYN as she was given a referral upon discharge from the emergency department peer return precautions discussed at length, patient stable for discharge. Dispostion:  After consideration of the diagnostic results and the patients response to treatment, I feel that the patent would benefit from treatment of her BV along with UTI with antibiotics.     Portions of this note were generated with Scientist, clinical (histocompatibility and immunogenetics). Dictation errors may occur despite best attempts at proofreading.   Final Clinical Impression(s) / ED Diagnoses Final diagnoses:  Acute cystitis without hematuria  Bacterial vaginosis    Rx / DC Orders ED Discharge Orders          Ordered    cephALEXin (KEFLEX) 500 MG capsule  2 times daily        08/31/23 1213    metroNIDAZOLE (FLAGYL) 500 MG tablet  2 times daily  08/31/23 1213              Claude Manges, PA-C 08/31/23 1217    Virgina Norfolk, DO 08/31/23 1321

## 2023-08-31 NOTE — ED Triage Notes (Signed)
 Onset Friday of lower pelvic cramping.  Clear virginal discharge.  States had cyst on ovral in past.

## 2023-08-31 NOTE — Discharge Instructions (Signed)
 I have prescribed antibiotics to help with  your urinary tract infection, please take 1 tablet twice a day for the next 7 days.   I have also prescribed flagyl to help treat your bacterial vaginosis, please DO NOT drink alcohol as this can endorse nausea.

## 2023-09-01 LAB — GC/CHLAMYDIA PROBE AMP (~~LOC~~) NOT AT ARMC
Chlamydia: NEGATIVE
Comment: NEGATIVE
Comment: NORMAL
Neisseria Gonorrhea: POSITIVE — AB

## 2023-09-02 ENCOUNTER — Encounter (HOSPITAL_BASED_OUTPATIENT_CLINIC_OR_DEPARTMENT_OTHER): Payer: Self-pay | Admitting: Emergency Medicine

## 2023-09-02 ENCOUNTER — Other Ambulatory Visit: Payer: Self-pay

## 2023-09-02 ENCOUNTER — Emergency Department (HOSPITAL_BASED_OUTPATIENT_CLINIC_OR_DEPARTMENT_OTHER)
Admission: EM | Admit: 2023-09-02 | Discharge: 2023-09-03 | Disposition: A | Discharge status: Home / Self Care | Attending: Emergency Medicine | Admitting: Emergency Medicine

## 2023-09-02 DIAGNOSIS — A549 Gonococcal infection, unspecified: Secondary | ICD-10-CM | POA: Diagnosis present

## 2023-09-02 LAB — URINALYSIS, ROUTINE W REFLEX MICROSCOPIC
Bacteria, UA: NONE SEEN
Bilirubin Urine: NEGATIVE
Glucose, UA: NEGATIVE mg/dL
Ketones, ur: NEGATIVE mg/dL
Nitrite: NEGATIVE
Specific Gravity, Urine: 1.028 (ref 1.005–1.030)
pH: 5.5 (ref 5.0–8.0)

## 2023-09-02 LAB — PREGNANCY, URINE: Preg Test, Ur: NEGATIVE

## 2023-09-02 MED ORDER — CEFTRIAXONE SODIUM 500 MG IJ SOLR
500.0000 mg | Freq: Once | INTRAMUSCULAR | Status: AC
  Administered 2023-09-02: 500 mg via INTRAMUSCULAR
  Filled 2023-09-02: qty 500

## 2023-09-02 NOTE — ED Triage Notes (Signed)
 Seen on 08/31/23  Called today for + gonorrhea, requires treatment

## 2023-09-02 NOTE — ED Provider Notes (Signed)
 [place] [facility] AT [facility] Provider Note   CSN: [id number] Arrival date & time: 09/02/23  2254     History  Chief Complaint  Patient presents with   SEXUALLY TRANSMITTED DISEASE    Lauren Diaz is a 33 y.o. female.  Patient returns requesting treatment for gonorrhea.  She was seen 2 days ago with pelvic pain and tested positive for BV and UTI.  She is currently taking Flagyl and Keflex.  She received a call today that she was positive for gonorrhea and is requesting treatment.  She reports her previous pelvic pain has resolved.  Denies any vaginal bleeding or discharge.  No fever, chills, nausea or vomiting, chest pain or shortness of breath.  No pain with urination or blood in the urine.  No fever.  No abdominal pain or back pain. no  Further pelvic pain  The history is provided by the patient.       Home Medications Prior to Admission medications   Medication Sig Start Date End Date Taking? Authorizing Provider  acetaminophen (TYLENOL) 500 MG tablet Take 500 mg by mouth as needed for headache.    [provider]  cephALEXin (KEFLEX) 500 MG capsule Take 1 capsule (500 mg total) by mouth 2 (two) times daily for 7 days. 08/31/23 09/07/23  Soto, Johana, PA-C  dicyclomine (BENTYL) 20 MG tablet Take 1 tablet (20 mg total) by mouth 2 (two) times daily. 05/17/22   Branham, Victoria C, MD  ketorolac (TORADOL) 10 MG tablet Take 1 tablet (10 mg total) by mouth every 6 (six) hours as needed. 05/17/22   Branham, Victoria C, MD  metroNIDAZOLE (FLAGYL) 500 MG tablet Take 1 tablet (500 mg total) by mouth 2 (two) times daily for 7 days. 08/31/23 09/07/23  Soto, Johana, PA-C  ondansetron (ZOFRAN) 4 MG tablet Take 1 tablet (4 mg total) by mouth every 6 (six) hours. 07/30/22   Prosperi, Christian H, PA-C  ondansetron (ZOFRAN-ODT) 4 MG disintegrating tablet Take 1 tablet (4 mg total) by mouth every 8 (eight) hours as needed for nausea or vomiting. 05/17/22   Branham,  Victoria C, MD  pantoprazole (PROTONIX) 20 MG tablet Take 1 tablet (20 mg total) by mouth 2 (two) times daily. 05/21/18 06/20/18  Agatha Alcon, CNM  Prenatal Vit-Fe Fumarate-FA (MULTIVITAMIN-PRENATAL) 27-0.8 MG TABS tablet Take 1 tablet by mouth daily at 12 noon.    [provider]      Allergies    Penicillins    Review of Systems   Review of Systems  Constitutional:  Negative for activity change, appetite change and fever.  HENT:  Negative for congestion and rhinorrhea.   Respiratory:  Negative for cough, chest tightness and shortness of breath.   Cardiovascular:  Negative for chest pain.  Gastrointestinal:  Negative for abdominal pain, nausea and vomiting.  Genitourinary:  Negative for dysuria and hematuria.  Musculoskeletal:  Negative for arthralgias and myalgias.  Neurological:  Negative for dizziness, weakness and headaches.   all other systems are negative except as noted in the HPI and PMH.    Physical Exam Updated Vital Signs BP 138/77 (BP Location: Right Arm)   Pulse 85   Temp 97.9 F (36.6 C)   Resp 16   LMP 08/22/2023 (Approximate)   SpO2 100%  Physical Exam Vitals and nursing note reviewed.  Constitutional:      General: She is not in acute distress.    Appearance: She is well-developed.  HENT:     Head:  Normocephalic and atraumatic.     Mouth/Throat:     Pharynx: No oropharyngeal exudate.  Eyes:     Conjunctiva/sclera: Conjunctivae normal.     Pupils: Pupils are equal, round, and reactive to light.  Neck:     Comments: No meningismus. Cardiovascular:     Rate and Rhythm: Normal rate and regular rhythm.     Heart sounds: Normal heart sounds. No murmur heard. Pulmonary:     Effort: Pulmonary effort is normal. No respiratory distress.     Breath sounds: Normal breath sounds.  Abdominal:     Palpations: Abdomen is soft.     Tenderness: There is no abdominal tenderness. There is no guarding or rebound.  Musculoskeletal:        General: No  tenderness. Normal range of motion.     Cervical back: Normal range of motion and neck supple.  Skin:    General: Skin is warm.  Neurological:     Mental Status: She is alert and oriented to person, place, and time.     Cranial Nerves: No cranial nerve deficit.     Motor: No abnormal muscle tone.     Coordination: Coordination normal.     Comments:  5/5 strength throughout. CN 2-12 intact.Equal grip strength.   Psychiatric:        Behavior: Behavior normal.     ED Results / Procedures / Treatments   Labs (all labs ordered are listed, but only abnormal results are displayed) Labs Reviewed  URINALYSIS, ROUTINE W REFLEX MICROSCOPIC - Abnormal; Notable for the following components:      Result Value   Hgb urine dipstick MODERATE (*)    Protein, ur TRACE (*)    Leukocytes,Ua MODERATE (*)    All other components within normal limits  URINE CULTURE  PREGNANCY, URINE    EKG None  Radiology No results found.  Procedures Procedures    Medications Ordered in ED Medications  cefTRIAXone (ROCEPHIN) injection 500 mg (has no administration in time range)    ED Course/ Medical Decision Making/ A&P                                 Medical Decision Making Amount and/or Complexity of Data Reviewed Labs: ordered. Decision-making details documented in ED Course. Radiology: ordered and independent interpretation performed. Decision-making details documented in ED Course. ECG/medicine tests: ordered and independent interpretation performed. Decision-making details documented in ED Course.  Risk Prescription drug management.   Pelvic pain several days ago now resolved.  Returns with positive gonorrhea test.  Denies symptoms.  Vitals are stable.  Abdomen soft without peritoneal signs  hCG is negative.  Urinalysis does show red blood cells and pyuria.  Will send for culture.  She is currently on Keflex.  Abdomen is soft and nontender.  No CVA tenderness.  No pelvic tenderness.  Low  suspicion for ovarian torsion, appendicitis, cholecystitis.  Will treat for gonorrhea with Rocephin shot.  Discussed that her sexual partner should be treated as well.  Follow-up with PCP as well as health department.  Return to the ED with new or worsening symptoms.        Final Clinical Impression(s) / ED Diagnoses Final diagnoses:  Gonorrhea    Rx / DC Orders ED Discharge Orders     None         Youssef Footman, Mara [place], MD 09/03/23 0025

## 2023-09-03 NOTE — Discharge Instructions (Signed)
 You are treated for gonorrhea today.  Your sexual partner should be treated as well.  Continue the antibiotics you are taking for bacterial vaginosis and UTI.  Return to the ED with new or worsening symptoms.

## 2023-09-04 LAB — URINE CULTURE: Culture: NO GROWTH

## 2024-01-31 ENCOUNTER — Encounter: Admitting: Obstetrics and Gynecology

## 2024-04-05 ENCOUNTER — Encounter: Admitting: Obstetrics and Gynecology

## 2024-04-18 ENCOUNTER — Other Ambulatory Visit: Payer: Self-pay

## 2024-04-18 ENCOUNTER — Other Ambulatory Visit (HOSPITAL_COMMUNITY)
Admission: RE | Admit: 2024-04-18 | Discharge: 2024-04-18 | Disposition: A | Source: Ambulatory Visit | Attending: Student | Admitting: Student

## 2024-04-18 ENCOUNTER — Encounter: Payer: Self-pay | Admitting: Student

## 2024-04-18 ENCOUNTER — Ambulatory Visit: Admitting: Student

## 2024-04-18 VITALS — BP 124/88 | HR 78 | Wt 217.1 lb

## 2024-04-18 DIAGNOSIS — N83202 Unspecified ovarian cyst, left side: Secondary | ICD-10-CM

## 2024-04-18 DIAGNOSIS — Z01419 Encounter for gynecological examination (general) (routine) without abnormal findings: Secondary | ICD-10-CM | POA: Insufficient documentation

## 2024-04-18 DIAGNOSIS — Z124 Encounter for screening for malignant neoplasm of cervix: Secondary | ICD-10-CM

## 2024-04-18 DIAGNOSIS — N898 Other specified noninflammatory disorders of vagina: Secondary | ICD-10-CM

## 2024-04-18 DIAGNOSIS — Z319 Encounter for procreative management, unspecified: Secondary | ICD-10-CM

## 2024-04-18 LAB — POCT PREGNANCY, URINE: Preg Test, Ur: NEGATIVE

## 2024-04-18 NOTE — Progress Notes (Signed)
 History:  [name] is a 33 y.o. [id number] who presents to clinic today for PAP smear and desire for pregnancy.   Patient has been trying to get pregnant with current partner for over 1 year. Patient's youngest child is 5 and from a different relationship.  Current partner has conceived 2 kids with a different female partner, and youngest is 31 years old.  Currently sexually active every other day. Periods are regular and come Q28-30days. Denies heavy bleeding or severe pain with cycles. Concerned regarding patients history of ovarian cyst and partners current recreational smoking and drinking.   The following portions of the patient's history were reviewed and updated as appropriate: allergies, current medications, family history, past medical history, social history, past surgical history and problem list.  Review of Systems:  Review of Systems  Constitutional: Negative.   Gastrointestinal: Negative.   Genitourinary: Negative.   Musculoskeletal: Negative.   Neurological: Negative.   Psychiatric/Behavioral: Negative.       Objective:  Physical Exam BP 124/88   Pulse 78   Wt 217 lb 1.6 oz (98.5 kg)   LMP 04/09/2024 (Exact Date)   BMI 35.04 kg/m  Physical Exam Vitals reviewed. Exam conducted with a chaperone present.  Constitutional:      Appearance: Normal appearance.  Abdominal:     Palpations: Abdomen is soft.  Genitourinary:    General: Normal vulva.     Exam position: Lithotomy position.     Vagina: Vaginal discharge present.     Cervix: Friability and cervical bleeding present. No cervical motion tenderness.  Musculoskeletal:        General: Normal range of motion.  Neurological:     Mental Status: She is alert and oriented to person, place, and time. Mental status is at baseline.  Psychiatric:        Mood and Affect: Mood normal.        Behavior: Behavior normal.       Labs and Imaging No results found for this or any previous visit (from the past 24  hours).  No results found.  Health Maintenance Due  Topic Date Due   Hepatitis B Vaccines 19-59 Average Risk (1 of 3 - 19+ 3-dose series) Never done   HPV VACCINES (1 - 3-dose SCDM series) Never done   Cervical Cancer Screening (HPV/Pap Cotest)  Never done   Influenza Vaccine  Never done   COVID-19 Vaccine (1 - 2025-26 season) Never done    Labs, imaging and previous visits in Epic and Care Everywhere reviewed  Assessment & Plan:   1. Women's annual routine gynecological examination (Primary) 2. Cervical cancer screening 3. Well woman exam - Cytology - PAP( [place]) - Hepatitis B Surface AntiGEN - Hepatitis C antibody - RPR - HIV Antibody (routine testing w rflx) - Cervicovaginal ancillary only( [place])  4. Left ovarian cyst - US  PELVIC COMPLETE WITH TRANSVAGINAL; Future  5. Desire for pregnancy - Counseled on preconception health and wellness promotion - Encouraged partner seeking medical evaluation. Discussed partner's habitual use of tobacco and alcohol may impact sperm quality - Continue sexual intercourse 2-3x/week. Discussed planning sexual intercourse around time of pvulation and fertility tracking - encouraged 2-3 month follow-up for continued support - Reassurance provided in the setting of regular, non-painful periods - Pregnancy, urine today, and encouraged at home testing as needed as well (discussed the potential of bleeding in pregnancy and to not solely rely on a cycle to know pregnancy status)  6. Vaginal discharge -  Cervicovaginal ancillary only( [place])   Approximately 20 minutes of total time was spent with this patient on counseling and coordination. The remainder of the time was spent on the physical examination.  Return in about 3 months (around 07/19/2024), or if symptoms worsen or fail to improve.  Lauren Ramdass, NP

## 2024-04-19 LAB — HEPATITIS B SURFACE ANTIGEN: Hepatitis B Surface Ag: NEGATIVE

## 2024-04-19 LAB — SYPHILIS: RPR W/REFLEX TO RPR TITER AND TREPONEMAL ANTIBODIES, TRADITIONAL SCREENING AND DIAGNOSIS ALGORITHM: RPR Ser Ql: NONREACTIVE

## 2024-04-19 LAB — HIV ANTIBODY (ROUTINE TESTING W REFLEX): HIV Screen 4th Generation wRfx: NONREACTIVE

## 2024-04-19 LAB — HEPATITIS C ANTIBODY: Hep C Virus Ab: NONREACTIVE

## 2024-04-20 ENCOUNTER — Ambulatory Visit: Payer: Self-pay | Admitting: Student

## 2024-04-20 ENCOUNTER — Other Ambulatory Visit: Payer: Self-pay | Admitting: Student

## 2024-04-20 DIAGNOSIS — B9689 Other specified bacterial agents as the cause of diseases classified elsewhere: Secondary | ICD-10-CM

## 2024-04-20 DIAGNOSIS — N898 Other specified noninflammatory disorders of vagina: Secondary | ICD-10-CM

## 2024-04-20 LAB — CERVICOVAGINAL ANCILLARY ONLY
Bacterial Vaginitis (gardnerella): POSITIVE — AB
Candida Glabrata: NEGATIVE
Candida Vaginitis: NEGATIVE
Comment: NEGATIVE
Comment: NEGATIVE
Comment: NEGATIVE

## 2024-04-20 MED ORDER — METRONIDAZOLE 500 MG PO TABS
500.0000 mg | ORAL_TABLET | Freq: Two times a day (BID) | ORAL | 0 refills | Status: AC
Start: 2024-04-20 — End: ?

## 2024-04-23 LAB — CYTOLOGY - PAP
Chlamydia: NEGATIVE
Comment: NEGATIVE
Comment: NEGATIVE
Comment: NEGATIVE
Comment: NORMAL
Diagnosis: NEGATIVE
High risk HPV: NEGATIVE
Neisseria Gonorrhea: NEGATIVE
Trichomonas: NEGATIVE
